# Patient Record
Sex: Female | Born: 1985 | Race: White | Hispanic: No | Marital: Married | Smoking: Current every day smoker
Health system: Southern US, Community
[De-identification: ages and names within clinical notes are randomized; demographics above are authoritative.]

## PROBLEM LIST (undated history)

## (undated) DIAGNOSIS — M1990 Unspecified osteoarthritis, unspecified site: Secondary | ICD-10-CM

## (undated) DIAGNOSIS — F329 Major depressive disorder, single episode, unspecified: Secondary | ICD-10-CM

## (undated) DIAGNOSIS — Z8619 Personal history of other infectious and parasitic diseases: Secondary | ICD-10-CM

## (undated) DIAGNOSIS — Z789 Other specified health status: Secondary | ICD-10-CM

## (undated) DIAGNOSIS — F419 Anxiety disorder, unspecified: Secondary | ICD-10-CM

## (undated) DIAGNOSIS — F32A Depression, unspecified: Secondary | ICD-10-CM

## (undated) HISTORY — PX: ANKLE SURGERY: SHX546

## (undated) HISTORY — PX: ANKLE ARTHROSCOPY: SUR85

## (undated) HISTORY — DX: Personal history of other infectious and parasitic diseases: Z86.19

## (undated) HISTORY — PX: OTHER SURGICAL HISTORY: SHX169

---
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
## 2009-06-20 ENCOUNTER — Emergency Department (HOSPITAL_COMMUNITY): Admission: EM | Admit: 2009-06-20 | Discharge: 2009-06-20 | Payer: Self-pay | Admitting: Emergency Medicine

## 2009-06-25 ENCOUNTER — Ambulatory Visit (HOSPITAL_COMMUNITY)
Admission: RE | Admit: 2009-06-25 | Discharge: 2009-06-25 | Payer: Self-pay | Source: Home / Self Care | Admitting: Unknown Physician Specialty

## 2010-06-24 ENCOUNTER — Ambulatory Visit
Admission: RE | Admit: 2010-06-24 | Discharge: 2010-06-24 | Payer: Self-pay | Source: Home / Self Care | Attending: Unknown Physician Specialty | Admitting: Unknown Physician Specialty

## 2010-06-24 LAB — HCG, SERUM, QUALITATIVE: Preg, Serum: NEGATIVE

## 2010-06-24 LAB — POCT HEMOGLOBIN-HEMACUE: Hemoglobin: 15.7 g/dL — ABNORMAL HIGH (ref 12.0–15.0)

## 2010-09-05 LAB — CBC
HCT: 41.3 % (ref 36.0–46.0)
Hemoglobin: 14.9 g/dL (ref 12.0–15.0)
MCHC: 36 g/dL (ref 30.0–36.0)
MCV: 83.9 fL (ref 78.0–100.0)
Platelets: 248 10*3/uL (ref 150–400)
RBC: 4.91 MIL/uL (ref 3.87–5.11)
RDW: 12.9 % (ref 11.5–15.5)
WBC: 11.1 10*3/uL — ABNORMAL HIGH (ref 4.0–10.5)

## 2011-10-24 LAB — OB RESULTS CONSOLE ANTIBODY SCREEN: Antibody Screen: NEGATIVE

## 2011-10-24 LAB — OB RESULTS CONSOLE GC/CHLAMYDIA
Chlamydia: NEGATIVE
Gonorrhea: NEGATIVE

## 2011-10-24 LAB — OB RESULTS CONSOLE ABO/RH: RH Type: NEGATIVE

## 2011-10-24 LAB — OB RESULTS CONSOLE HEPATITIS B SURFACE ANTIGEN: Hepatitis B Surface Ag: NEGATIVE

## 2011-10-24 LAB — OB RESULTS CONSOLE RPR: RPR: NONREACTIVE

## 2011-10-24 LAB — OB RESULTS CONSOLE RUBELLA ANTIBODY, IGM: Rubella: IMMUNE

## 2011-10-24 LAB — OB RESULTS CONSOLE HIV ANTIBODY (ROUTINE TESTING): HIV: NONREACTIVE

## 2012-03-06 LAB — OB RESULTS CONSOLE GBS: GBS: POSITIVE

## 2012-04-02 ENCOUNTER — Encounter (HOSPITAL_COMMUNITY): Payer: Self-pay | Admitting: *Deleted

## 2012-04-02 ENCOUNTER — Telehealth (HOSPITAL_COMMUNITY): Payer: Self-pay | Admitting: *Deleted

## 2012-04-02 NOTE — Telephone Encounter (Signed)
Preadmission screen  

## 2012-04-09 ENCOUNTER — Inpatient Hospital Stay (HOSPITAL_COMMUNITY)
Admission: RE | Admit: 2012-04-09 | Discharge: 2012-04-12 | DRG: 766 | Disposition: A | Discharge status: Home / Self Care | Payer: Medicaid Other | Source: Ambulatory Visit | Attending: Obstetrics and Gynecology | Admitting: Obstetrics and Gynecology

## 2012-04-09 ENCOUNTER — Encounter (HOSPITAL_COMMUNITY): Payer: Self-pay

## 2012-04-09 DIAGNOSIS — O99892 Other specified diseases and conditions complicating childbirth: Secondary | ICD-10-CM | POA: Diagnosis present

## 2012-04-09 DIAGNOSIS — O330 Maternal care for disproportion due to deformity of maternal pelvic bones: Secondary | ICD-10-CM | POA: Diagnosis present

## 2012-04-09 DIAGNOSIS — O480 Post-term pregnancy: Principal | ICD-10-CM | POA: Diagnosis present

## 2012-04-09 DIAGNOSIS — Z22330 Carrier of Group B streptococcus: Secondary | ICD-10-CM

## 2012-04-09 DIAGNOSIS — O3660X0 Maternal care for excessive fetal growth, unspecified trimester, not applicable or unspecified: Secondary | ICD-10-CM | POA: Diagnosis present

## 2012-04-09 DIAGNOSIS — O339 Maternal care for disproportion, unspecified: Secondary | ICD-10-CM | POA: Diagnosis present

## 2012-04-09 HISTORY — DX: Other specified health status: Z78.9

## 2012-04-09 LAB — CBC
HCT: 39.2 % (ref 36.0–46.0)
RBC: 4.87 MIL/uL (ref 3.87–5.11)
RDW: 13.8 % (ref 11.5–15.5)
WBC: 12.6 10*3/uL — ABNORMAL HIGH (ref 4.0–10.5)

## 2012-04-09 LAB — TYPE AND SCREEN
ABO/RH(D): AB NEG
Antibody Screen: NEGATIVE

## 2012-04-09 MED ORDER — ONDANSETRON HCL 4 MG/2ML IJ SOLN
4.0000 mg | Freq: Four times a day (QID) | INTRAMUSCULAR | Status: DC | PRN
  Administered 2012-04-10: 4 mg via INTRAVENOUS
  Filled 2012-04-09: qty 2

## 2012-04-09 MED ORDER — EPHEDRINE 5 MG/ML INJ: 10.0000 mg | INTRAVENOUS | Status: DC | PRN

## 2012-04-09 MED ORDER — OXYTOCIN BOLUS FROM INFUSION
500.0000 mL | INTRAVENOUS | Status: DC
  Filled 2012-04-09 (×86): qty 500

## 2012-04-09 MED ORDER — FLEET ENEMA 7-19 GM/118ML RE ENEM: 1.0000 | ENEMA | Freq: Once | RECTAL | Status: DC

## 2012-04-09 MED ORDER — PENICILLIN G POTASSIUM 5000000 UNITS IJ SOLR
2.5000 10*6.[IU] | INTRAVENOUS | Status: DC
  Administered 2012-04-10 (×5): 2.5 10*6.[IU] via INTRAVENOUS
  Filled 2012-04-09 (×8): qty 2.5

## 2012-04-09 MED ORDER — IBUPROFEN 600 MG PO TABS: 600.0000 mg | ORAL_TABLET | Freq: Four times a day (QID) | ORAL | Status: DC | PRN

## 2012-04-09 MED ORDER — LIDOCAINE HCL (PF) 1 % IJ SOLN: 30.0000 mL | INTRAMUSCULAR | Status: DC | PRN

## 2012-04-09 MED ORDER — PENICILLIN G POTASSIUM 5000000 UNITS IJ SOLR
5.0000 10*6.[IU] | Freq: Once | INTRAVENOUS | Status: AC
  Administered 2012-04-09: 5 10*6.[IU] via INTRAVENOUS
  Filled 2012-04-09: qty 5

## 2012-04-09 MED ORDER — PHENYLEPHRINE 40 MCG/ML (10ML) SYRINGE FOR IV PUSH (FOR BLOOD PRESSURE SUPPORT)
80.0000 ug | PREFILLED_SYRINGE | INTRAVENOUS | Status: DC | PRN
  Filled 2012-04-09: qty 5

## 2012-04-09 MED ORDER — LACTATED RINGERS IV SOLN
INTRAVENOUS | Status: DC
  Administered 2012-04-09 – 2012-04-10 (×3): via INTRAVENOUS

## 2012-04-09 MED ORDER — DIPHENHYDRAMINE HCL 50 MG/ML IJ SOLN: 12.5000 mg | INTRAMUSCULAR | Status: DC | PRN

## 2012-04-09 MED ORDER — FENTANYL 2.5 MCG/ML BUPIVACAINE 1/10 % EPIDURAL INFUSION (WH - ANES)
14.0000 mL/h | INTRAMUSCULAR | Status: DC
  Administered 2012-04-10 (×2): 14 mL/h via EPIDURAL
  Filled 2012-04-09 (×2): qty 125

## 2012-04-09 MED ORDER — ACETAMINOPHEN 325 MG PO TABS
650.0000 mg | ORAL_TABLET | ORAL | Status: DC | PRN
  Administered 2012-04-10: 650 mg via ORAL
  Filled 2012-04-09: qty 2

## 2012-04-09 MED ORDER — PHENYLEPHRINE 40 MCG/ML (10ML) SYRINGE FOR IV PUSH (FOR BLOOD PRESSURE SUPPORT): 80.0000 ug | PREFILLED_SYRINGE | INTRAVENOUS | Status: DC | PRN

## 2012-04-09 MED ORDER — OXYTOCIN 40 UNITS IN LACTATED RINGERS INFUSION - SIMPLE MED
1.0000 m[IU]/min | INTRAVENOUS | Status: DC
  Administered 2012-04-09: 4 m[IU]/min via INTRAVENOUS
  Administered 2012-04-09: 2 m[IU]/min via INTRAVENOUS

## 2012-04-09 MED ORDER — OXYTOCIN 40 UNITS IN LACTATED RINGERS INFUSION - SIMPLE MED
1.0000 m[IU]/min | INTRAVENOUS | Status: DC
  Filled 2012-04-09: qty 1000

## 2012-04-09 MED ORDER — TERBUTALINE SULFATE 1 MG/ML IJ SOLN: 0.2500 mg | Freq: Once | INTRAMUSCULAR | Status: AC | PRN

## 2012-04-09 MED ORDER — LACTATED RINGERS IV SOLN: 500.0000 mL | INTRAVENOUS | Status: DC | PRN

## 2012-04-09 MED ORDER — CITRIC ACID-SODIUM CITRATE 334-500 MG/5ML PO SOLN
30.0000 mL | ORAL | Status: DC | PRN
  Administered 2012-04-10: 30 mL via ORAL
  Filled 2012-04-09: qty 15

## 2012-04-09 MED ORDER — BUTORPHANOL TARTRATE 1 MG/ML IJ SOLN
1.0000 mg | INTRAMUSCULAR | Status: DC | PRN
  Administered 2012-04-09: 1 mg via INTRAVENOUS
  Filled 2012-04-09: qty 1

## 2012-04-09 MED ORDER — EPHEDRINE 5 MG/ML INJ
10.0000 mg | INTRAVENOUS | Status: DC | PRN
  Filled 2012-04-09: qty 4

## 2012-04-09 MED ORDER — LACTATED RINGERS IV SOLN
500.0000 mL | Freq: Once | INTRAVENOUS | Status: AC
  Administered 2012-04-10: 500 mL via INTRAVENOUS

## 2012-04-09 MED ORDER — OXYCODONE-ACETAMINOPHEN 5-325 MG PO TABS
1.0000 | ORAL_TABLET | ORAL | Status: DC | PRN
Start: 2012-04-09 — End: 2012-04-10

## 2012-04-09 MED ORDER — OXYTOCIN 40 UNITS IN LACTATED RINGERS INFUSION - SIMPLE MED: 62.5000 mL/h | INTRAVENOUS | Status: DC

## 2012-04-10 ENCOUNTER — Encounter (HOSPITAL_COMMUNITY)
Admission: RE | Disposition: A | Discharge status: Home / Self Care | Payer: Self-pay | Source: Ambulatory Visit | Attending: Obstetrics and Gynecology

## 2012-04-10 ENCOUNTER — Encounter (HOSPITAL_COMMUNITY): Payer: Self-pay | Admitting: Anesthesiology

## 2012-04-10 ENCOUNTER — Encounter (HOSPITAL_COMMUNITY): Payer: Self-pay

## 2012-04-10 ENCOUNTER — Inpatient Hospital Stay (HOSPITAL_COMMUNITY): Payer: Medicaid Other | Admitting: Anesthesiology

## 2012-04-10 SURGERY — Surgical Case
Anesthesia: Regional | Site: Abdomen | Wound class: Clean Contaminated

## 2012-04-10 MED ORDER — ONDANSETRON HCL 4 MG/2ML IJ SOLN: 4.0000 mg | Freq: Three times a day (TID) | INTRAMUSCULAR | Status: DC | PRN

## 2012-04-10 MED ORDER — MEPERIDINE HCL 25 MG/ML IJ SOLN
INTRAMUSCULAR | Status: DC | PRN
  Administered 2012-04-10: 25 mg via INTRAVENOUS

## 2012-04-10 MED ORDER — SODIUM CHLORIDE 0.9 % IV SOLN
1.0000 ug/kg/h | INTRAVENOUS | Status: DC | PRN
  Filled 2012-04-10: qty 2.5

## 2012-04-10 MED ORDER — SODIUM BICARBONATE 8.4 % IV SOLN
INTRAVENOUS | Status: DC | PRN
  Administered 2012-04-10: 5 mL via EPIDURAL

## 2012-04-10 MED ORDER — METOCLOPRAMIDE HCL 5 MG/ML IJ SOLN: 10.0000 mg | Freq: Three times a day (TID) | INTRAMUSCULAR | Status: DC | PRN

## 2012-04-10 MED ORDER — OXYTOCIN 40 UNITS IN LACTATED RINGERS INFUSION - SIMPLE MED: 62.5000 mL/h | INTRAVENOUS | Status: AC

## 2012-04-10 MED ORDER — SENNOSIDES-DOCUSATE SODIUM 8.6-50 MG PO TABS
2.0000 | ORAL_TABLET | Freq: Every day | ORAL | Status: DC
  Administered 2012-04-11: 2 via ORAL

## 2012-04-10 MED ORDER — WITCH HAZEL-GLYCERIN EX PADS: 1.0000 "application " | MEDICATED_PAD | CUTANEOUS | Status: DC | PRN

## 2012-04-10 MED ORDER — SODIUM CHLORIDE 0.9 % IJ SOLN: 3.0000 mL | INTRAMUSCULAR | Status: DC | PRN

## 2012-04-10 MED ORDER — ONDANSETRON HCL 4 MG/2ML IJ SOLN: 4.0000 mg | INTRAMUSCULAR | Status: DC | PRN

## 2012-04-10 MED ORDER — LANOLIN HYDROUS EX OINT: 1.0000 "application " | TOPICAL_OINTMENT | CUTANEOUS | Status: DC | PRN

## 2012-04-10 MED ORDER — MENTHOL 3 MG MT LOZG: 1.0000 | LOZENGE | OROMUCOSAL | Status: DC | PRN

## 2012-04-10 MED ORDER — PROMETHAZINE HCL 25 MG/ML IJ SOLN: 6.2500 mg | INTRAMUSCULAR | Status: DC | PRN

## 2012-04-10 MED ORDER — PHENYLEPHRINE HCL 10 MG/ML IJ SOLN
INTRAMUSCULAR | Status: DC | PRN
  Administered 2012-04-10: 80 ug via INTRAVENOUS
  Administered 2012-04-10: 40 ug via INTRAVENOUS
  Administered 2012-04-10 (×3): 80 ug via INTRAVENOUS
  Administered 2012-04-10: 40 ug via INTRAVENOUS

## 2012-04-10 MED ORDER — SCOPOLAMINE 1 MG/3DAYS TD PT72
1.0000 | MEDICATED_PATCH | Freq: Once | TRANSDERMAL | Status: DC
  Administered 2012-04-10: 1.5 mg via TRANSDERMAL

## 2012-04-10 MED ORDER — DIPHENHYDRAMINE HCL 25 MG PO CAPS: 25.0000 mg | ORAL_CAPSULE | ORAL | Status: DC | PRN

## 2012-04-10 MED ORDER — NALBUPHINE HCL 10 MG/ML IJ SOLN
5.0000 mg | INTRAMUSCULAR | Status: DC | PRN
  Filled 2012-04-10: qty 1

## 2012-04-10 MED ORDER — DIPHENHYDRAMINE HCL 25 MG PO CAPS: 25.0000 mg | ORAL_CAPSULE | Freq: Four times a day (QID) | ORAL | Status: DC | PRN

## 2012-04-10 MED ORDER — MORPHINE SULFATE (PF) 0.5 MG/ML IJ SOLN
INTRAMUSCULAR | Status: DC | PRN
  Administered 2012-04-10: 1 mg via INTRAVENOUS
  Administered 2012-04-10: 4 mg via EPIDURAL

## 2012-04-10 MED ORDER — SODIUM BICARBONATE 8.4 % IV SOLN
INTRAVENOUS | Status: AC
  Filled 2012-04-10: qty 50

## 2012-04-10 MED ORDER — LIDOCAINE HCL (PF) 1 % IJ SOLN
INTRAMUSCULAR | Status: DC | PRN
  Administered 2012-04-10 (×2): 5 mL

## 2012-04-10 MED ORDER — NALOXONE HCL 0.4 MG/ML IJ SOLN: 0.4000 mg | INTRAMUSCULAR | Status: DC | PRN

## 2012-04-10 MED ORDER — OXYCODONE-ACETAMINOPHEN 5-325 MG PO TABS
1.0000 | ORAL_TABLET | ORAL | Status: DC | PRN
  Administered 2012-04-11: 1 via ORAL
  Administered 2012-04-11: 2 via ORAL
  Administered 2012-04-11 – 2012-04-12 (×3): 1 via ORAL
  Filled 2012-04-10: qty 2
  Filled 2012-04-10 (×4): qty 1
  Filled 2012-04-10: qty 2

## 2012-04-10 MED ORDER — IBUPROFEN 600 MG PO TABS
600.0000 mg | ORAL_TABLET | Freq: Four times a day (QID) | ORAL | Status: DC
  Administered 2012-04-11 – 2012-04-12 (×6): 600 mg via ORAL
  Filled 2012-04-10 (×6): qty 1

## 2012-04-10 MED ORDER — LACTATED RINGERS IV SOLN
INTRAVENOUS | Status: DC | PRN
  Administered 2012-04-10 (×2): via INTRAVENOUS

## 2012-04-10 MED ORDER — LACTATED RINGERS IV SOLN
INTRAVENOUS | Status: DC
  Administered 2012-04-11: 02:00:00 via INTRAVENOUS

## 2012-04-10 MED ORDER — OXYTOCIN 10 UNIT/ML IJ SOLN
40.0000 [IU] | INTRAVENOUS | Status: DC | PRN
  Administered 2012-04-10: 40 [IU] via INTRAVENOUS

## 2012-04-10 MED ORDER — KETOROLAC TROMETHAMINE 30 MG/ML IJ SOLN
INTRAMUSCULAR | Status: AC
  Filled 2012-04-10: qty 1

## 2012-04-10 MED ORDER — ONDANSETRON HCL 4 MG/2ML IJ SOLN
INTRAMUSCULAR | Status: AC
  Filled 2012-04-10: qty 2

## 2012-04-10 MED ORDER — LIDOCAINE-EPINEPHRINE (PF) 2 %-1:200000 IJ SOLN
INTRAMUSCULAR | Status: AC
  Filled 2012-04-10: qty 20

## 2012-04-10 MED ORDER — OXYTOCIN 10 UNIT/ML IJ SOLN
INTRAMUSCULAR | Status: AC
  Filled 2012-04-10: qty 4

## 2012-04-10 MED ORDER — SIMETHICONE 80 MG PO CHEW: 80.0000 mg | CHEWABLE_TABLET | ORAL | Status: DC | PRN

## 2012-04-10 MED ORDER — MORPHINE SULFATE 0.5 MG/ML IJ SOLN
INTRAMUSCULAR | Status: AC
  Filled 2012-04-10: qty 10

## 2012-04-10 MED ORDER — MEPERIDINE HCL 25 MG/ML IJ SOLN
6.2500 mg | INTRAMUSCULAR | Status: DC | PRN
  Administered 2012-04-10: 6.25 mg via INTRAVENOUS

## 2012-04-10 MED ORDER — MIDAZOLAM HCL 2 MG/2ML IJ SOLN: 0.5000 mg | Freq: Once | INTRAMUSCULAR | Status: DC | PRN

## 2012-04-10 MED ORDER — DIBUCAINE 1 % RE OINT: 1.0000 "application " | TOPICAL_OINTMENT | RECTAL | Status: DC | PRN

## 2012-04-10 MED ORDER — MEPERIDINE HCL 25 MG/ML IJ SOLN: 6.2500 mg | INTRAMUSCULAR | Status: DC | PRN

## 2012-04-10 MED ORDER — KETOROLAC TROMETHAMINE 30 MG/ML IJ SOLN
30.0000 mg | Freq: Four times a day (QID) | INTRAMUSCULAR | Status: DC | PRN
  Administered 2012-04-10: 30 mg via INTRAMUSCULAR

## 2012-04-10 MED ORDER — ONDANSETRON HCL 4 MG PO TABS: 4.0000 mg | ORAL_TABLET | ORAL | Status: DC | PRN

## 2012-04-10 MED ORDER — TETANUS-DIPHTH-ACELL PERTUSSIS 5-2.5-18.5 LF-MCG/0.5 IM SUSP
0.5000 mL | Freq: Once | INTRAMUSCULAR | Status: AC
  Administered 2012-04-11: 0.5 mL via INTRAMUSCULAR

## 2012-04-10 MED ORDER — MEPERIDINE HCL 25 MG/ML IJ SOLN
INTRAMUSCULAR | Status: AC
  Filled 2012-04-10: qty 1

## 2012-04-10 MED ORDER — ACETAMINOPHEN 10 MG/ML IV SOLN
1000.0000 mg | Freq: Four times a day (QID) | INTRAVENOUS | Status: AC | PRN
  Administered 2012-04-11: 1000 mg via INTRAVENOUS
  Filled 2012-04-10 (×2): qty 100

## 2012-04-10 MED ORDER — SIMETHICONE 80 MG PO CHEW
80.0000 mg | CHEWABLE_TABLET | Freq: Three times a day (TID) | ORAL | Status: DC
  Administered 2012-04-11 – 2012-04-12 (×5): 80 mg via ORAL

## 2012-04-10 MED ORDER — SCOPOLAMINE 1 MG/3DAYS TD PT72
MEDICATED_PATCH | TRANSDERMAL | Status: AC
  Administered 2012-04-10: 1.5 mg via TRANSDERMAL
  Filled 2012-04-10: qty 1

## 2012-04-10 MED ORDER — PRENATAL MULTIVITAMIN CH
1.0000 | ORAL_TABLET | Freq: Every day | ORAL | Status: DC
  Administered 2012-04-11 – 2012-04-12 (×2): 1 via ORAL
  Filled 2012-04-10: qty 1

## 2012-04-10 MED ORDER — DIPHENHYDRAMINE HCL 50 MG/ML IJ SOLN: 25.0000 mg | INTRAMUSCULAR | Status: DC | PRN

## 2012-04-10 MED ORDER — NALBUPHINE HCL 10 MG/ML IJ SOLN
5.0000 mg | INTRAMUSCULAR | Status: DC | PRN
  Administered 2012-04-10: 5 mg via INTRAVENOUS
  Filled 2012-04-10 (×2): qty 1

## 2012-04-10 MED ORDER — ZOLPIDEM TARTRATE 5 MG PO TABS: 5.0000 mg | ORAL_TABLET | Freq: Every evening | ORAL | Status: DC | PRN

## 2012-04-10 MED ORDER — FENTANYL CITRATE 0.05 MG/ML IJ SOLN
INTRAMUSCULAR | Status: AC
  Administered 2012-04-10: 50 ug via INTRAVENOUS
  Filled 2012-04-10: qty 2

## 2012-04-10 MED ORDER — KETOROLAC TROMETHAMINE 30 MG/ML IJ SOLN: 30.0000 mg | Freq: Four times a day (QID) | INTRAMUSCULAR | Status: DC | PRN

## 2012-04-10 MED ORDER — FENTANYL CITRATE 0.05 MG/ML IJ SOLN
25.0000 ug | INTRAMUSCULAR | Status: DC | PRN
  Administered 2012-04-10 (×2): 50 ug via INTRAVENOUS

## 2012-04-10 MED ORDER — FENTANYL CITRATE 0.05 MG/ML IJ SOLN
INTRAMUSCULAR | Status: AC
  Filled 2012-04-10: qty 2

## 2012-04-10 MED ORDER — DIPHENHYDRAMINE HCL 50 MG/ML IJ SOLN: 12.5000 mg | INTRAMUSCULAR | Status: DC | PRN

## 2012-04-10 MED ORDER — ONDANSETRON HCL 4 MG/2ML IJ SOLN
INTRAMUSCULAR | Status: DC | PRN
  Administered 2012-04-10: 4 mg via INTRAVENOUS

## 2012-04-10 MED ORDER — FENTANYL CITRATE 0.05 MG/ML IJ SOLN
INTRAMUSCULAR | Status: DC | PRN
  Administered 2012-04-10: 100 ug via INTRAVENOUS

## 2012-04-10 SURGICAL SUPPLY — 36 items
ADH SKN CLS APL DERMABOND .7 (GAUZE/BANDAGES/DRESSINGS) ×1
CLOTH BEACON ORANGE TIMEOUT ST (SAFETY) ×2 IMPLANT
DERMABOND ADVANCED (GAUZE/BANDAGES/DRESSINGS) ×1
DERMABOND ADVANCED .7 DNX12 (GAUZE/BANDAGES/DRESSINGS) IMPLANT
DRAPE SURG 17X23 STRL (DRAPES) ×2 IMPLANT
DRESSING TELFA 8X3 (GAUZE/BANDAGES/DRESSINGS) ×2 IMPLANT
DRSG COVADERM 4X10 (GAUZE/BANDAGES/DRESSINGS) IMPLANT
DURAPREP 26ML APPLICATOR (WOUND CARE) ×2 IMPLANT
ELECT REM PT RETURN 9FT ADLT (ELECTROSURGICAL) ×2
ELECTRODE REM PT RTRN 9FT ADLT (ELECTROSURGICAL) ×1 IMPLANT
EXTRACTOR VACUUM M CUP 4 TUBE (SUCTIONS) IMPLANT
GAUZE SPONGE 4X4 12PLY STRL LF (GAUZE/BANDAGES/DRESSINGS) ×4 IMPLANT
GLOVE BIO SURGEON STRL SZ7 (GLOVE) ×4 IMPLANT
GOWN PREVENTION PLUS LG XLONG (DISPOSABLE) ×4 IMPLANT
KIT ABG SYR 3ML LUER SLIP (SYRINGE) IMPLANT
NEEDLE HYPO 25X5/8 SAFETYGLIDE (NEEDLE) IMPLANT
NS IRRIG 1000ML POUR BTL (IV SOLUTION) ×2 IMPLANT
PACK C SECTION WH (CUSTOM PROCEDURE TRAY) ×2 IMPLANT
PAD ABD 7.5X8 STRL (GAUZE/BANDAGES/DRESSINGS) ×2 IMPLANT
PAD OB MATERNITY 4.3X12.25 (PERSONAL CARE ITEMS) IMPLANT
RTRCTR C-SECT PINK 25CM LRG (MISCELLANEOUS) ×1 IMPLANT
RTRCTR C-SECT PINK 34CM XLRG (MISCELLANEOUS) IMPLANT
SLEEVE SCD COMPRESS KNEE MED (MISCELLANEOUS) IMPLANT
SPONGE GAUZE 4X4 12PLY (GAUZE/BANDAGES/DRESSINGS) ×1 IMPLANT
STAPLER VISISTAT 35W (STAPLE) IMPLANT
SUT CHROMIC 1 CTX 36 (SUTURE) ×5 IMPLANT
SUT CHROMIC 2 0 CT 1 (SUTURE) ×2 IMPLANT
SUT PDS AB 0 CTX 60 (SUTURE) ×2 IMPLANT
SUT PLAIN 2 0 XLH (SUTURE) ×2 IMPLANT
SUT VIC AB 2-0 CT1 27 (SUTURE) ×2
SUT VIC AB 2-0 CT1 TAPERPNT 27 (SUTURE) ×1 IMPLANT
SUT VIC AB 4-0 KS 27 (SUTURE) ×1 IMPLANT
TAPE CLOTH SURG 4X10 WHT LF (GAUZE/BANDAGES/DRESSINGS) ×1 IMPLANT
TOWEL OR 17X24 6PK STRL BLUE (TOWEL DISPOSABLE) ×4 IMPLANT
TRAY FOLEY CATH 14FR (SET/KITS/TRAYS/PACK) ×3 IMPLANT
WATER STERILE IRR 1000ML POUR (IV SOLUTION) ×2 IMPLANT

## 2012-04-10 NOTE — Anesthesia Procedure Notes (Signed)
Epidural Patient location during procedure: OB Start time: 04/10/2012 3:30 AM  Staffing Anesthesiologist: Brayton Caves R Performed by: anesthesiologist   Preanesthetic Checklist Completed: patient identified, site marked, surgical consent, pre-op evaluation, timeout performed, IV checked, risks and benefits discussed and monitors and equipment checked  Epidural Patient position: sitting Prep: site prepped and draped and DuraPrep Patient monitoring: continuous pulse ox and blood pressure Approach: midline Injection technique: LOR air and LOR saline  Needle:  Needle type: Tuohy  Needle gauge: 17 G Needle length: 9 cm and 9 Needle insertion depth: 8 cm Catheter type: closed end flexible Catheter size: 19 Gauge Catheter at skin depth: 14 cm Test dose: negative  Assessment Events: blood not aspirated, injection not painful, no injection resistance, negative IV test and no paresthesia  Additional Notes Patient identified.  Risk benefits discussed including failed block, incomplete pain control, headache, nerve damage, paralysis, blood pressure changes, nausea, vomiting, reactions to medication both toxic or allergic, and postpartum back pain.  Patient expressed understanding and wished to proceed.  All questions were answered.  Sterile technique used throughout procedure and epidural site dressed with sterile barrier dressing. No paresthesia or other complications noted.The patient did not experience any signs of intravascular injection such as tinnitus or metallic taste in mouth nor signs of intrathecal spread such as rapid motor block. Please see nursing notes for vital signs.

## 2012-04-10 NOTE — Progress Notes (Signed)
Press photographer, house coverage, anesthesia, and nursery notified of c/s.  Chg bath done.  Betadine nasal swab done. Abdominal shave done.

## 2012-04-10 NOTE — H&P (Signed)
Pt is a 26 year old white female G1P0 at [redacted] weeks EGA who is admitted for induction secondary to postterm pregnancy. PNC was uncomplicated. Pt had a normal Quad screen, an abnormal 1 hour OGTT but a normal 3 hour OGTT.  Pt had a +GBS. On admission pt was contracting every 3-5 minutes.  Her cervix was 3cm.  PMHx: See [place].  PE: VSSAF        HEENT-wnl        ABD- gravid, non tender        FHTs- reactive IMP/ IUP at 41 weeks,         Postterm Plan/ Admit for induction+

## 2012-04-10 NOTE — Progress Notes (Signed)
Pt now 5cm. Pt had SROM with clear fluid. FHTs reactive.

## 2012-04-10 NOTE — Progress Notes (Signed)
Patient ID: Veronica Richardson, female   DOB: September 30, 1985, 26 y.o.   MRN: [id number]  S: Comfortable O: AVFSS cvx 5/80/-2 toco Q2-4, MVUs 240 FHT 140 reactive no decels  A/P 1) No significant change in cervix since IUPC placement despite adequate MVUs 2) FWB reassuring

## 2012-04-10 NOTE — Anesthesia Preprocedure Evaluation (Signed)

## 2012-04-10 NOTE — OR Nursing (Addendum)
FETAL HEART RATE 135 in OR  2001 FEMALE  Placenta to OR utility  Surgeon wanted dermabond and pressure dressing explained that we were instructed not to place dressing over dermabond surgeon insisted

## 2012-04-10 NOTE — Transfer of Care (Signed)
Immediate Anesthesia Transfer of Care Note  Patient: Veronica Richardson  Procedure(s) Performed: Procedure(s) (LRB) with comments: CESAREAN SECTION (N/A)  Patient Location: PACU  Anesthesia Type: Epidural  Level of Consciousness: awake, alert  and oriented  Airway & Oxygen Therapy: Patient Spontanous Breathing  Post-op Assessment: Report given to PACU RN and Post -op Vital signs reviewed and stable  Post vital signs: Reviewed and stable  Complications: No apparent anesthesia complications

## 2012-04-10 NOTE — Op Note (Signed)
Pre-Operative Diagnosis: 1) 41 week intrauterine pregnancy 2) Failure to progress 3) Suspected macrosomia Postoperative Diagnosis: Same Procedure: Primary low transverse cesarean section Surgeon: Dr. Waynard Reeds Assistant: none Operative Findings: Vigorous female infant in vertex occiput posterior presentation with apgars of 9 & 9. Normal ovaries, tubes and uterus.  Weight pending Specimen: Placenta for disposal EBL: Total I/O In: 1500 [I.V.:1500] Out: 800 [Urine:100; Blood:700]   Procedure:Veronica Richardson is an 26 year old gravida 1 para 0 at 41 weeks and 0 days estimated gestational age who presents for cesarean section. The patient was admitted for a postdates induction of labor on 10/21. At that time she was 3cm dilated and the patient was contracting. Pitocin was initiated and the patient progressed to 5 cm at 630 am on 10/22. Amniotomy and an IUPC were placed at 9:30 am.  Labor was adequate by MVUs after IUPC placement.  Labor continued to be adequate an by 5:30 pm the patient had only dilated to 5-6 cm.  Given adequate labor for 9 hours and no significant change the decision was made to proceed with cesarean section.  R/B/A reviewed with the patient. Following the appropriate informed consent the patient was brought to the operating room where epidural anesthesia was administered and found to be adequate. She was placed in the dorsal supine position with a leftward tilt. She was prepped and draped in the normal sterile fashion. Scalpel was then used to make a Pfannenstiel skin incision which was carried down to the underlying layers of soft tissue to the fascia. The fascia was incised in the midline and the fascial incision was extended laterally with Mayo scissors. The superior aspect of the fascial incision was grasped with Coker clamps x2, tented up and the rectus muscles dissected off sharply with the electrocautery unit area and the same procedure was repeated on the inferior aspect of the  fascial incision. The rectus muscles were separated in the midline. The abdominal peritoneum was identified, tented up, entered sharply, and the incision was extended superiorly and inferiorly with good visualization of the bladder. The Alexis retractor was then deployed. The vesicouterine peritoneum was identified, tented up, entered sharply, and the bladder flap was created digitally. Scalpel was then used to make a low transverse incision on the uterus which was extended laterally with both blunt dissection and the bandage scissors. The fetal vertex was identified, delivered easily through the uterine incision followed by the body. The infant was bulb suctioned on the operative field cried vigorously, cord was clamped and cut and the infant was passed to the waiting neonatologist. Placenta was then delivered spontaneously, the uterus was cleared of all clot and debris. The uterine incision was repaired with #1 chromic in running locked fashion followed by a second imbricating layer. Ovaries and tubes were inspected and normal. The Alexis retractor was removed. The uterus was returned to the abdominal cavity the abdominal cavity was cleared of all clot and debris. The abdominal peritoneum was reapproximated with 2-0 Vicryl in a running fashion, the rectus muscles was reapproximated with #1 chromic in a running fashion. The fascia was closed with a looped PDS in a running fashion. The skin was closed with 4-0 vicryl in a subcuticular fashion and Dermabond. All sponge lap and needle counts were correct x2. Patient tolerated the procedure well and recovered in stable condition following the procedure.

## 2012-04-10 NOTE — Progress Notes (Signed)
  Patient ID: Rolanda Jay, female   DOB: 06-Aug-1985, 26 y.o.   MRN: [id number]  S: Comfortable with epidural O:  Filed Vitals:   04/10/12 1101 04/10/12 1106 04/10/12 1132 04/10/12 1201  BP: 128/67  97/53 103/49  Pulse: 100  98 97  Temp:  98.8 F (37.1 C)    TempSrc:  Oral    Resp:  18    Height:      Weight:      SpO2:       AOX3, NAD Obese, gravid, soft FHT 140 reactive with accels, no decels CVX 5/80/-2, pubic arch palpably narrow toco irregularly tracing, Q1-4, MVUs after IUPC placement 240  A/P IUPC placed without difficulty Cervix unchanged since 630 this morning. Pt has been followed for S>D during this pregnancy. Multiple growth scans have shown EFW > 90%.  Last ultrasound 2 weeks ago showed EFW 8#7.  Given adequate MVUs this is concerning for CPD

## 2012-04-10 NOTE — Consult Note (Signed)
Neonatology Note:   Attendance at C-section:    I was asked to attend this primary C/S at term due to [facility]. The mother is a G1P0 AB neg, GBS pos with abnormal 1 hour GTT but normal 3-hour test. She smoked prior to pregnancy, but stopped when she became pregnant. ROM 18 hours prior to delivery, fluid meconium-stained. Mother received Pen G for almost 24 hours prior to delivery and has been afebrile during labor. Infant vigorous at birth with good spontaneous cry and tone. Needed only minimal bulb suctioning. Ap 9/9. Baby is LGA with molded head and deep sacral dimple. Lungs clear to ausc in DR. To CN to care of Pediatrician.   Deatra James, MD

## 2012-04-10 NOTE — Progress Notes (Signed)
Patient ID: Veronica Richardson, female   DOB: 1985-11-30, 26 y.o.   MRN: [id number]  S: Still comfortable O: AFVSS cvx 5+/90/-1 FHT 140 reactive with accels toco q2-4  A/P 1) No significant cervical change in 12 hours.  Adequate labor since IUPC placed. Will proceed with cesarean section for failure to progress, suspected CPD. R/B/A reviewed with patient.

## 2012-04-10 NOTE — Anesthesia Postprocedure Evaluation (Signed)
Anesthesia Post Note  Patient: Veronica Richardson  Procedure(s) Performed: Procedure(s) (LRB): CESAREAN SECTION (N/A)  Anesthesia type: Epidural  Patient location: PACU  Post pain: Pain level controlled  Post assessment: Post-op Vital signs reviewed  Last Vitals:  Filed Vitals:   04/10/12 2053  BP:   Pulse:   Temp: 36.8 C  Resp: 16    Post vital signs: Reviewed  Level of consciousness: awake  Complications: No apparent anesthesia complications

## 2012-04-11 ENCOUNTER — Encounter (HOSPITAL_COMMUNITY): Payer: Self-pay | Admitting: Obstetrics and Gynecology

## 2012-04-11 LAB — CBC
HCT: 32.2 % — ABNORMAL LOW (ref 36.0–46.0)
MCV: 81.1 fL (ref 78.0–100.0)
RDW: 13.8 % (ref 11.5–15.5)
WBC: 14.5 10*3/uL — ABNORMAL HIGH (ref 4.0–10.5)

## 2012-04-11 MED ORDER — RHO D IMMUNE GLOBULIN 1500 UNIT/2ML IJ SOLN
300.0000 ug | Freq: Once | INTRAMUSCULAR | Status: AC
  Administered 2012-04-11: 300 ug via INTRAMUSCULAR
  Filled 2012-04-11: qty 2

## 2012-04-11 NOTE — Anesthesia Postprocedure Evaluation (Signed)
  Anesthesia Post-op Note  Patient: Veronica Richardson  Procedure(s) Performed: Procedure(s) (LRB) with comments: CESAREAN SECTION (N/A)  Patient Location: Mother/Baby  Anesthesia Type: Epidural  Level of Consciousness: awake, alert  and oriented  Airway and Oxygen Therapy: Patient Spontanous Breathing  Post-op Pain: mild  Post-op Assessment: Post-op Vital signs reviewed, Patient's Cardiovascular Status Stable, No headache, No backache, No residual numbness and No residual motor weakness  Post-op Vital Signs: Reviewed and stable  Complications: No apparent anesthesia complications

## 2012-04-11 NOTE — Progress Notes (Signed)
Post Op Day 1 Subjective: no complaints  Objective: Blood pressure 153/93, pulse 93, temperature 98.7 F (37.1 C), temperature source Oral, resp. rate 20, height 5\' 6"  (1.676 m), weight 226 lb (102.513 kg), breastfeeding.  Physical Exam:  General: alert Lochia: appropriate Uterine Fundus: firm Incision: no significant drainage   Basename 04/11/12 0500 04/09/12 1925  HGB 10.9* 13.4  HCT 32.2* 39.2    Assessment/Plan: Stable.  Will continue post op observation.   LOS: 2 days   Veronica Richardson D 04/11/2012, 9:08 AM

## 2012-04-11 NOTE — Addendum Note (Signed)
Addendum  created 04/11/12 0804 by Graciela Husbands, CRNA   Modules edited:Notes Section

## 2012-04-11 NOTE — Progress Notes (Signed)
Ur chart review completed.  
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
## 2012-04-12 LAB — RH IG WORKUP (INCLUDES ABO/RH)
Fetal Screen: NEGATIVE
Unit division: 0

## 2012-04-12 MED ORDER — OXYCODONE-ACETAMINOPHEN 5-325 MG PO TABS
1.0000 | ORAL_TABLET | ORAL | Status: DC | PRN
Start: 2012-04-12 — End: 2013-01-12

## 2012-04-12 MED ORDER — OXYCODONE-ACETAMINOPHEN 5-325 MG PO TABS: 1.0000 | ORAL_TABLET | ORAL | Status: DC | PRN

## 2012-04-12 NOTE — Progress Notes (Signed)
  Patient is eating, ambulating, voiding.  Pain control is good.  Filed Vitals:   04/11/12 1425 04/11/12 1915 04/11/12 2205 04/12/12 0505  BP: 109/66 120/79 125/83 124/78  Pulse: 103 105 98 93  Temp: 98.1 F (36.7 C) 98.2 F (36.8 C) 98.2 F (36.8 C) 98.3 F (36.8 C)  TempSrc:  Oral Oral Oral  Resp: 20 18 20 20   Height:      Weight:      SpO2:  98%  98%    lungs:   clear to auscultation cor:    RRR Abdomen:  soft, appropriate tenderness, incisions intact and without erythema or exudate ex:    no cords   Lab Results  Component Value Date   WBC 14.5* 04/11/2012   HGB 10.9* 04/11/2012   HCT 32.2* 04/11/2012   MCV 81.1 04/11/2012   PLT 125* 04/11/2012    --/--/AB NEG (10/23 0500)/RI  A/P    Post operative day 2.  Routine post op and postpartum care.  Expect d/c today.  Percocet for pain control.  Baby RH +- Rhogam given.

## 2012-04-20 NOTE — Discharge Summary (Signed)
Obstetric Discharge Summary Reason for Admission: induction of labor Prenatal Procedures: NST Intrapartum Procedures: cesarean: low cervical, transverse Postpartum Procedures: none Complications-Operative and Postpartum: none Hemoglobin  Date Value Range Status  04/11/2012 10.9* 12.0 - 15.0 g/dL Final     DELTA CHECK NOTED     REPEATED TO VERIFY     HCT  Date Value Range Status  04/11/2012 32.2* 36.0 - 46.0 % Final   Hospital Course: Pt admitted for induction for postdates, dilated to 5 cm and then did not progress.  Pt went for uncomplicated c/s and then was d/ced without comp on POD 2.  Discharge Diagnoses: Term Pregnancy-delivered  Discharge Information: Date: 04/20/2012 Activity: pelvic rest Diet: routine Medications: Percocet Condition: stable Instructions: refer to practice specific booklet Discharge to: home   Newborn Data: Live born female  Birth Weight: 9 lb 3.6 oz (4184 g) APGAR: 9, 9  Home with mother.  Veronica Richardson A 04/20/2012, 1:23 PM

## 2013-01-12 ENCOUNTER — Encounter (HOSPITAL_COMMUNITY): Payer: Self-pay | Admitting: Emergency Medicine

## 2013-01-12 ENCOUNTER — Emergency Department (INDEPENDENT_AMBULATORY_CARE_PROVIDER_SITE_OTHER)
Admission: EM | Admit: 2013-01-12 | Discharge: 2013-01-12 | Disposition: A | Discharge status: Home / Self Care | Payer: No Typology Code available for payment source | Source: Home / Self Care

## 2013-01-12 DIAGNOSIS — K0889 Other specified disorders of teeth and supporting structures: Secondary | ICD-10-CM

## 2013-01-12 DIAGNOSIS — K089 Disorder of teeth and supporting structures, unspecified: Secondary | ICD-10-CM

## 2013-01-12 MED ORDER — HYDROCODONE-ACETAMINOPHEN 5-325 MG PO TABS: 1.0000 | ORAL_TABLET | Freq: Four times a day (QID) | ORAL | Status: DC | PRN

## 2013-01-12 MED ORDER — AMOXICILLIN-POT CLAVULANATE 500-125 MG PO TABS: 1.0000 | ORAL_TABLET | Freq: Three times a day (TID) | ORAL | Status: DC

## 2013-01-12 NOTE — ED Notes (Signed)
Pt c/o dental pain onset 3 weeks... sxs include: pain, headache... Denies: fevers, swelling... Taking ibup/tyle w/no relief... Alert w/no signs of acute distress.

## 2013-01-12 NOTE — ED Provider Notes (Signed)
Veronica Richardson is a 27 y.o. female who presents to Urgent Care today for tooth pain. This is been present for several weeks. She has pain in her upper right tooth. She has  Tried over-the-counter pain medications which have not worked. She denies any fevers or chills. She has the pain radiates to her right face. She does not yet have a dental appointment.    PMH reviewed. Otherwise healthy History  Substance Use Topics  . Smoking status: Former Smoker    Quit date: 04/02/2012  . Smokeless tobacco: Never Used  . Alcohol Use: No   ROS as above Medications reviewed. No current facility-administered medications for this encounter.   Current Outpatient Prescriptions  Medication Sig Dispense Refill  . amoxicillin-clavulanate (AUGMENTIN) 500-125 MG per tablet Take 1 tablet (500 mg total) by mouth 3 (three) times daily.  30 tablet  0  . HYDROcodone-acetaminophen (NORCO) 5-325 MG per tablet Take 1 tablet by mouth every 6 (six) hours as needed for pain.  30 tablet  0  . omeprazole (PRILOSEC) 20 MG capsule Take 20 mg by mouth daily.        Exam:  BP 120/80  Pulse 84  Temp(Src) 98.5 F (36.9 C) (Oral)  Resp 18  SpO2 99%  Breastfeeding? No Gen: Well NAD HEENT: EOMI,  MMM, gumline retraction surrounding the right upper premolar tender to touch with surrounding erythema.  Lungs: CTABL Nl WOB Heart: RRR no MRG Abd: NABS, NT, ND Exts: Non edematous BL  LE, warm and well perfused.   No results found for this or any previous visit (from the past 24 hour(s)). No results found.  Assessment and Plan: 27 y.o. female with dental pain.  Likely infected tooth.  Plan to treat with Augmentin and Norco.  Refer to dentistry.  Discussed warning signs or symptoms. Please see discharge instructions. Patient expresses understanding.      Rodolph Bong, MD 01/12/13 Zollie Pee

## 2013-01-17 ENCOUNTER — Emergency Department (HOSPITAL_COMMUNITY)
Admission: EM | Admit: 2013-01-17 | Discharge: 2013-01-17 | Disposition: A | Discharge status: Home / Self Care | Payer: No Typology Code available for payment source | Attending: Emergency Medicine | Admitting: Emergency Medicine

## 2013-01-17 ENCOUNTER — Emergency Department (HOSPITAL_COMMUNITY): Payer: No Typology Code available for payment source

## 2013-01-17 ENCOUNTER — Ambulatory Visit: Payer: No Typology Code available for payment source | Attending: Family Medicine | Admitting: Family Medicine

## 2013-01-17 ENCOUNTER — Encounter (HOSPITAL_COMMUNITY): Payer: Self-pay | Admitting: *Deleted

## 2013-01-17 ENCOUNTER — Encounter: Payer: Self-pay | Admitting: Family Medicine

## 2013-01-17 VITALS — BP 114/77 | HR 79 | Temp 98.7°F | Ht 66.0 in | Wt 198.0 lb

## 2013-01-17 DIAGNOSIS — Y9389 Activity, other specified: Secondary | ICD-10-CM | POA: Insufficient documentation

## 2013-01-17 DIAGNOSIS — S161XXA Strain of muscle, fascia and tendon at neck level, initial encounter: Secondary | ICD-10-CM

## 2013-01-17 DIAGNOSIS — R1031 Right lower quadrant pain: Secondary | ICD-10-CM | POA: Insufficient documentation

## 2013-01-17 DIAGNOSIS — Z3202 Encounter for pregnancy test, result negative: Secondary | ICD-10-CM | POA: Insufficient documentation

## 2013-01-17 DIAGNOSIS — Z8619 Personal history of other infectious and parasitic diseases: Secondary | ICD-10-CM | POA: Insufficient documentation

## 2013-01-17 DIAGNOSIS — S0990XA Unspecified injury of head, initial encounter: Secondary | ICD-10-CM | POA: Insufficient documentation

## 2013-01-17 DIAGNOSIS — K029 Dental caries, unspecified: Secondary | ICD-10-CM | POA: Insufficient documentation

## 2013-01-17 DIAGNOSIS — Z87891 Personal history of nicotine dependence: Secondary | ICD-10-CM | POA: Insufficient documentation

## 2013-01-17 DIAGNOSIS — Y92410 Unspecified street and highway as the place of occurrence of the external cause: Secondary | ICD-10-CM | POA: Insufficient documentation

## 2013-01-17 DIAGNOSIS — K0510 Chronic gingivitis, plaque induced: Secondary | ICD-10-CM

## 2013-01-17 DIAGNOSIS — K089 Disorder of teeth and supporting structures, unspecified: Secondary | ICD-10-CM | POA: Insufficient documentation

## 2013-01-17 DIAGNOSIS — Z792 Long term (current) use of antibiotics: Secondary | ICD-10-CM | POA: Insufficient documentation

## 2013-01-17 DIAGNOSIS — S139XXA Sprain of joints and ligaments of unspecified parts of neck, initial encounter: Secondary | ICD-10-CM | POA: Insufficient documentation

## 2013-01-17 LAB — POCT I-STAT, CHEM 8
BUN: 12 mg/dL (ref 6–23)
Creatinine, Ser: 0.8 mg/dL (ref 0.50–1.10)
Glucose, Bld: 90 mg/dL (ref 70–99)
Hemoglobin: 16.7 g/dL — ABNORMAL HIGH (ref 12.0–15.0)
Potassium: 4 mEq/L (ref 3.5–5.1)
Sodium: 140 mEq/L (ref 135–145)

## 2013-01-17 LAB — URINALYSIS, ROUTINE W REFLEX MICROSCOPIC
Bilirubin Urine: NEGATIVE
Glucose, UA: NEGATIVE mg/dL
Ketones, ur: NEGATIVE mg/dL
Leukocytes, UA: NEGATIVE
Nitrite: NEGATIVE
Protein, ur: 100 mg/dL — AB
Specific Gravity, Urine: 1.024 (ref 1.005–1.030)
Urobilinogen, UA: 1 mg/dL (ref 0.0–1.0)
pH: 6 (ref 5.0–8.0)

## 2013-01-17 LAB — URINE MICROSCOPIC-ADD ON

## 2013-01-17 MED ORDER — TETANUS-DIPHTH-ACELL PERTUSSIS 5-2.5-18.5 LF-MCG/0.5 IM SUSP
0.5000 mL | Freq: Once | INTRAMUSCULAR | Status: AC
  Administered 2013-01-17: 0.5 mL via INTRAMUSCULAR
  Filled 2013-01-17: qty 0.5

## 2013-01-17 MED ORDER — IBUPROFEN 800 MG PO TABS: 800.0000 mg | ORAL_TABLET | Freq: Three times a day (TID) | ORAL | Status: DC

## 2013-01-17 MED ORDER — HYDROCODONE-ACETAMINOPHEN 5-325 MG PO TABS: 2.0000 | ORAL_TABLET | ORAL | Status: DC | PRN

## 2013-01-17 MED ORDER — IBUPROFEN 800 MG PO TABS: 800.0000 mg | ORAL_TABLET | Freq: Three times a day (TID) | ORAL | Status: DC | PRN

## 2013-01-17 MED ORDER — IOHEXOL 300 MG/ML  SOLN
80.0000 mL | Freq: Once | INTRAMUSCULAR | Status: AC | PRN
  Administered 2013-01-17: 90 mL via INTRAVENOUS

## 2013-01-17 MED ORDER — HYDROCODONE-ACETAMINOPHEN 5-325 MG PO TABS: 1.0000 | ORAL_TABLET | Freq: Three times a day (TID) | ORAL | Status: DC | PRN

## 2013-01-17 NOTE — Discharge Instructions (Signed)
Cervical Sprain There is no evidence of broken bone or serious injury. Return to the ED if you develop new or worsening symptoms. A cervical sprain is an injury in the neck in which the ligaments are stretched or torn. The ligaments are the tissues that hold the bones of the neck (vertebrae) in place.Cervical sprains can range from very mild to very severe. Most cervical sprains get better in 1 to 3 weeks, but it depends on the cause and extent of the injury. Severe cervical sprains can cause the neck vertebrae to be unstable. This can lead to damage of the spinal cord and can result in serious nervous system problems. Your caregiver will determine whether your cervical sprain is mild or severe. CAUSES  Severe cervical sprains may be caused by:  Contact sport injuries (football, rugby, wrestling, hockey, auto racing, gymnastics, diving, martial arts, boxing).  Motor vehicle collisions.  Whiplash injuries. This means the neck is forcefully whipped backward and forward.  Falls. Mild cervical sprains may be caused by:   Awkward positions, such as cradling a telephone between your ear and shoulder.  Sitting in a chair that does not offer proper support.  Working at a poorly Marketing executive station.  Activities that require looking up or down for long periods of time. SYMPTOMS   Pain, soreness, stiffness, or a burning sensation in the front, back, or sides of the neck. This discomfort may develop immediately after injury or it may develop slowly and not begin for 24 hours or more after an injury.  Pain or tenderness directly in the middle of the back of the neck.  Shoulder or upper back pain.  Limited ability to move the neck.  Headache.  Dizziness.  Weakness, numbness, or tingling in the hands or arms.  Muscle spasms.  Difficulty swallowing or chewing.  Tenderness and swelling of the neck. DIAGNOSIS  Most of the time, your caregiver can diagnose this problem by taking your  history and doing a physical exam. Your caregiver will ask about any known problems, such as arthritis in the neck or a previous neck injury. X-rays may be taken to find out if there are any other problems, such as problems with the bones of the neck. However, an X-ray often does not reveal the full extent of a cervical sprain. Other tests such as a computed tomography (CT) scan or magnetic resonance imaging (MRI) may be needed. TREATMENT  Treatment depends on the severity of the cervical sprain. Mild sprains can be treated with rest, keeping the neck in place (immobilization), and pain medicines. Severe cervical sprains need immediate immobilization and an appointment with an orthopedist or neurosurgeon. Several treatment options are available to help with pain, muscle spasms, and other symptoms. Your caregiver may prescribe:  Medicines, such as pain relievers, numbing medicines, or muscle relaxants.  Physical therapy. This can include stretching exercises, strengthening exercises, and posture training. Exercises and improved posture can help stabilize the neck, strengthen muscles, and help stop symptoms from returning.  A neck collar to be worn for short periods of time. Often, these collars are worn for comfort. However, certain collars may be worn to protect the neck and prevent further worsening of a serious cervical sprain. HOME CARE INSTRUCTIONS   Put ice on the injured area.  Put ice in a plastic bag.  Place a towel between your skin and the bag.  Leave the ice on for 15-20 minutes, 3-4 times a day.  Only take over-the-counter or prescription medicines for pain,  discomfort, or fever as directed by your caregiver.  Keep all follow-up appointments as directed by your caregiver.  Keep all physical therapy appointments as directed by your caregiver.  If a neck collar is prescribed, wear it as directed by your caregiver.  Do not drive while wearing a neck collar.  Make any needed  adjustments to your work station to promote good posture.  Avoid positions and activities that make your symptoms worse.  Warm up and stretch before being active to help prevent problems. SEEK MEDICAL CARE IF:   Your pain is not controlled with medicine.  You are unable to decrease your pain medicine over time as planned.  Your activity level is not improving as expected. SEEK IMMEDIATE MEDICAL CARE IF:   You develop any bleeding, stomach upset, or signs of an allergic reaction to your medicine.  Your symptoms get worse.  You develop new, unexplained symptoms.  You have numbness, tingling, weakness, or paralysis in any part of your body. MAKE SURE YOU:   Understand these instructions.  Will watch your condition.  Will get help right away if you are not doing well or get worse. Document Released: 04/03/2007 Document Revised: 08/29/2011 Document Reviewed: 03/09/2011 [facility] Patient Information 2014 [place], [place].
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
## 2013-01-17 NOTE — Progress Notes (Signed)
Patient ID: Veronica Richardson, female   DOB: 13-Sep-1985, 27 y.o.   MRN: [id number]  CC:  Tooth Pain  HPI:  27 year old white female is here for right upper tooth pain for the past month that is getting worse.  She was seen at the Urgent Care Saturday and was given Augmentin and Norco.  She is till taking the antibiotics, and claims the Norco has not helped with her pain.  Patient is not sure if dental referral was made or not.  Claims she is in severe pain 10/10. The pain causes her to have headache.  She denies any fever.  No Known Allergies Past Medical History  Diagnosis Date  . H/O varicella   . No pertinent past medical history    Current Outpatient Prescriptions on File Prior to Visit  Medication Sig Dispense Refill  . amoxicillin-clavulanate (AUGMENTIN) 500-125 MG per tablet Take 1 tablet (500 mg total) by mouth 3 (three) times daily.  30 tablet  0  . HYDROcodone-acetaminophen (NORCO) 5-325 MG per tablet Take 1 tablet by mouth every 6 (six) hours as needed for pain.  30 tablet  0  . omeprazole (PRILOSEC) 20 MG capsule Take 20 mg by mouth daily.       No current facility-administered medications on file prior to visit.   Family History  Problem Relation Age of Onset  . Hypertension Father   . Hypertension Maternal Aunt   . COPD Maternal Aunt   . Diabetes Maternal Aunt   . Bipolar disorder Maternal Aunt   . Hypertension Maternal Uncle   . Hypertension Paternal Uncle   . Diabetes Paternal Uncle   . Heart disease Maternal Grandmother   . Heart disease Maternal Grandfather    History   Social History  . Marital Status: Single    Spouse Name: N/A    Number of Children: N/A  . Years of Education: N/A   Occupational History  . Not on file.   Social History Main Topics  . Smoking status: Former Smoker    Quit date: 04/02/2012  . Smokeless tobacco: Never Used  . Alcohol Use: No  . Drug Use: No  . Sexually Active: Yes   Other Topics Concern  . Not on file   Social  History Narrative  . No narrative on file    Review of Systems ______ Constitutional: Negative for fever, chills, diaphoresis, activity change, appetite change and fatigue. ____ HENT: Negative for ear pain, nosebleeds, congestion, facial swelling, rhinorrhea, neck pain, neck stiffness and ear discharge.  ____ Eyes: Negative for pain, discharge, redness, itching and visual disturbance. ____ Respiratory: Negative for cough, choking, chest tightness, shortness of breath, wheezing and stridor.  ____ Cardiovascular: Negative for chest pain, palpitations and leg swelling. ____ Gastrointestinal: Negative for abdominal distention. ____ Genitourinary: Negative for dysuria, urgency, frequency, hematuria, flank pain, decreased urine volume, difficulty urinating and dyspareunia. ____ Musculoskeletal: Negative for back pain, joint swelling, arthralgias and gait problem. ________ Neurological: Negative for dizziness, tremors, seizures, syncope, facial asymmetry, speech difficulty, weakness, light-headedness, numbness and headaches. ____ Hematological: Negative for adenopathy. Does not bruise/bleed easily. ____ Psychiatric/Behavioral: Negative for hallucinations, behavioral problems, confusion, dysphoric mood, decreased concentration and agitation. ______   Objective:   Filed Vitals:   01/17/13 1157  BP: 114/77  Pulse: 79  Temp: 98.7 F (37.1 C)    Physical Exam ______ Constitutional: Appears well-developed and well-nourished. No distress. ____ HENT: Normocephalic. External right and left ear normal. Oropharynx is clear and moist. ____ Eyes:  Conjunctivae and EOM are normal. PERRLA, no scleral icterus. ____ Neck: Normal ROM. Neck supple. No JVD. No tracheal deviation. No thyromegaly. ____ CVS: RRR, S1/S2 +, no murmurs, no gallops, no carotid bruit.  Pulmonary: Effort and breath sounds normal, no stridor, rhonchi, wheezes, rales.  Abdominal: Soft. BS +,  no distension, tenderness, rebound or  guarding. ________ Musculoskeletal: Normal range of motion. No edema and no tenderness. ____ Lymphadenopathy: No lymphadenopathy noted, cervical, inguinal. Neuro: Alert. Normal reflexes, muscle tone coordination. No cranial nerve deficit. Skin: Skin is warm and dry. No rash noted. Not diaphoretic. No erythema. No pallor. ____ Psychiatric: Normal mood and affect. Behavior, judgment, thought content normal. __  Lab Results  Component Value Date   WBC 14.5* 04/11/2012   HGB 10.9* 04/11/2012   HCT 32.2* 04/11/2012   MCV 81.1 04/11/2012   PLT 125* 04/11/2012   No results found for this basename: CREATININE, BUN, NA, K, CL, CO2    No results found for this basename: HGBA1C   Lipid Panel  No results found for this basename: chol, trig, hdl, cholhdl, vldl, ldlcalc       Assessment and plan:   There are no active problems to display for this patient.

## 2013-01-17 NOTE — Progress Notes (Signed)
Patient ID: Veronica Richardson, female   DOB: 1985/10/11, 26 y.o.   MRN: [id number]  CC:  New Patient   HPI: Pt complaining of dental pain for 1 month. She was seen at urgent care about 4 days ago and given augmentin and vicodin.  Still having pain and pt is asking for dental referral.  Pt has a [facility] card.  Pt says she is doubling up on vicodin dose because of pain.  No nausea or vomiting.  No fever or chills.  No facial pain.   No Known Allergies Past Medical History  Diagnosis Date  . H/O varicella   . No pertinent past medical history    Current Outpatient Prescriptions on File Prior to Visit  Medication Sig Dispense Refill  . amoxicillin-clavulanate (AUGMENTIN) 500-125 MG per tablet Take 1 tablet (500 mg total) by mouth 3 (three) times daily.  30 tablet  0  . omeprazole (PRILOSEC) 20 MG capsule Take 20 mg by mouth daily.       No current facility-administered medications on file prior to visit.   Family History  Problem Relation Age of Onset  . Hypertension Father   . Hypertension Maternal Aunt   . COPD Maternal Aunt   . Diabetes Maternal Aunt   . Bipolar disorder Maternal Aunt   . Hypertension Maternal Uncle   . Hypertension Paternal Uncle   . Diabetes Paternal Uncle   . Heart disease Maternal Grandmother   . Heart disease Maternal Grandfather    History   Social History  . Marital Status: Single    Spouse Name: N/A    Number of Children: N/A  . Years of Education: N/A   Occupational History  . Not on file.   Social History Main Topics  . Smoking status: Former Smoker    Quit date: 04/02/2012  . Smokeless tobacco: Never Used  . Alcohol Use: No  . Drug Use: No  . Sexually Active: Yes   Other Topics Concern  . Not on file   Social History Narrative  . No narrative on file    Review of Systems  Constitutional: Negative for fever, chills, diaphoresis, activity change, appetite change and fatigue.  HENT: Negative for ear pain, nosebleeds, congestion, facial  swelling, rhinorrhea, neck pain, neck stiffness and ear discharge.  Dental pain as above.  Eyes: Negative for pain, discharge, redness, itching and visual disturbance.  Respiratory: Negative for cough, choking, chest tightness, shortness of breath, wheezing and stridor.   Cardiovascular: Negative for chest pain, palpitations and leg swelling.  Gastrointestinal: Negative for abdominal distention.  Genitourinary: Negative for dysuria, urgency, frequency, hematuria, flank pain, decreased urine volume, difficulty urinating and dyspareunia.  Musculoskeletal: Negative for back pain, joint swelling, arthralgias and gait problem.  Neurological: Negative for dizziness, tremors, seizures, syncope, facial asymmetry, speech difficulty, weakness, light-headedness, numbness and headaches.  Hematological: Negative for adenopathy. Does not bruise/bleed easily.  Psychiatric/Behavioral: Negative for hallucinations, behavioral problems, confusion, dysphoric mood, decreased concentration and agitation.    Objective:   Filed Vitals:   01/17/13 1157  BP: 114/77  Pulse: 79  Temp: 98.7 F (37.1 C)    Physical Exam  Constitutional: Appears well-developed and well-nourished. No distress.  HENT: Normocephalic. External right and left ear normal. Oropharynx is clear and moist. severe gingivitis seen.  Eyes: Conjunctivae and EOM are normal. PERRLA, no scleral icterus.  Neck: Normal ROM. Neck supple. No JVD. No tracheal deviation. No thyromegaly.  CVS: RRR, S1/S2 +, no murmurs, no gallops, no carotid bruit.  Pulmonary: Effort and breath sounds normal, no stridor, rhonchi, wheezes, rales.  Abdominal: Soft. BS +,  no distension, tenderness, rebound or guarding.  Musculoskeletal: Normal range of motion. No edema and no tenderness.  Lymphadenopathy: No lymphadenopathy noted, cervical, inguinal. Neuro: Alert. Normal reflexes, muscle tone coordination. No cranial nerve deficit. Skin: Skin is warm and dry. No rash  noted. Not diaphoretic. No erythema. No pallor.  Psychiatric: Normal mood and affect. Behavior, judgment, thought content normal.   Lab Results  Component Value Date   WBC 14.5* 04/11/2012   HGB 10.9* 04/11/2012   HCT 32.2* 04/11/2012   MCV 81.1 04/11/2012   PLT 125* 04/11/2012   No results found for this basename: CREATININE, BUN, NA, K, CL, CO2    No results found for this basename: HGBA1C   Lipid Panel  No results found for this basename: chol, trig, hdl, cholhdl, vldl, ldlcalc       Assessment and plan:   Patient Active Problem List   Diagnosis Date Noted  . Pain due to dental caries 01/17/2013  . Gingivitis 01/17/2013   Ibuprofen 800 mg po every 8 hours prn pain   Vicodin prn severe pain  Dental referral made for urgent care  The patient was given clear instructions to go to ER or return to medical center if symptoms don't improve, worsen or new problems develop.  The patient verbalized understanding.  The patient was told to call to get any lab results if not heard anything in the next week.    RTC in 3 months  Rodney Langton, MD, CDE, FAAFP [facility] [place], [place]
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
## 2013-01-17 NOTE — Patient Instructions (Addendum)
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
## 2013-01-17 NOTE — ED Notes (Addendum)
Pt placed on bedpan. No signs of distress. PT informed her husband will be back. Pt reports she is anxious.

## 2013-01-18 LAB — URINE CULTURE: Culture: NO GROWTH

## 2014-04-21 ENCOUNTER — Encounter (HOSPITAL_COMMUNITY): Payer: Self-pay | Admitting: *Deleted

## 2016-07-12 ENCOUNTER — Emergency Department (HOSPITAL_COMMUNITY)
Admission: EM | Admit: 2016-07-12 | Discharge: 2016-07-13 | Disposition: A | Discharge status: Home / Self Care | Payer: Self-pay | Attending: Emergency Medicine | Admitting: Emergency Medicine

## 2016-07-12 ENCOUNTER — Encounter (HOSPITAL_COMMUNITY): Payer: Self-pay | Admitting: Emergency Medicine

## 2016-07-12 ENCOUNTER — Emergency Department (HOSPITAL_COMMUNITY): Payer: Self-pay

## 2016-07-12 DIAGNOSIS — G8929 Other chronic pain: Secondary | ICD-10-CM | POA: Insufficient documentation

## 2016-07-12 DIAGNOSIS — M25571 Pain in right ankle and joints of right foot: Secondary | ICD-10-CM | POA: Insufficient documentation

## 2016-07-12 DIAGNOSIS — Z87891 Personal history of nicotine dependence: Secondary | ICD-10-CM | POA: Insufficient documentation

## 2016-07-12 DIAGNOSIS — Z79899 Other long term (current) drug therapy: Secondary | ICD-10-CM | POA: Insufficient documentation

## 2016-07-12 MED ORDER — OXYCODONE-ACETAMINOPHEN 5-325 MG PO TABS
1.0000 | ORAL_TABLET | Freq: Once | ORAL | Status: AC
  Administered 2016-07-13: 1 via ORAL
  Filled 2016-07-12: qty 1

## 2016-07-12 NOTE — ED Triage Notes (Signed)
Pt. reports worsening chronic right ankle pain for several months unrelieved by OTC pain medications , denies injury/ambulatory .

## 2016-07-12 NOTE — ED Provider Notes (Signed)
[facility]-EMERGENCY DEPT Provider Note   CSN: [id number] Arrival date & time: 07/12/16  2207  By signing my name below, I, Javier Docker, attest that this documentation has been prepared under the direction and in the presence of Felicie Morn, NP. Electronically Signed: Javier Docker, ER Scribe. 01/30/2016. 11:57 PM.  History   Chief Complaint Chief Complaint  Patient presents with  . Ankle Pain    Right   The history is provided by the patient. No language interpreter was used.   HPI Comments: Veronica Richardson is a 31 y.o. female who presents to the Emergency Department complaining of an acute exacerbation over the last several days of chronic right ankle pain. She has a past hx of ankle break followed by surgical repair. Her surgery was done at [facility]. She has NKDA. She denies weakness, numbness or tingling. No recent injury to ankle. 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   Past Medical History:  Diagnosis Date  . H/O varicella   . No pertinent past medical history     Patient Active Problem List   Diagnosis Date Noted  . Pain due to dental caries 01/17/2013  . Gingivitis 01/17/2013    Past Surgical History:  Procedure Laterality Date  . ANKLE SURGERY Right   . CESAREAN SECTION  04/10/2012   Procedure: CESAREAN SECTION;  Surgeon: Freddrick March. Tenny Craw, MD;  Location: [facility];  Service: Obstetrics;  Laterality: N/A;  . right ankle surgery      OB History    Gravida Para Term Preterm AB Living   1 1 1  0 0 1   SAB TAB Ectopic Multiple Live Births   0 0 0 0 1       Home Medications    Prior to Admission medications   Medication Sig Start Date End Date Taking? Authorizing Provider  amoxicillin-clavulanate (AUGMENTIN) 500-125 MG per tablet Take 1 tablet (500 mg total) by mouth 3 (three) times daily. 01/12/13   Rodolph Bong, MD  HYDROcodone-acetaminophen (NORCO/VICODIN) 5-325 MG per tablet Take 1-2 tablets by mouth every 6 (six) hours as needed for  pain.    Historical Provider, MD  HYDROcodone-acetaminophen (NORCO/VICODIN) 5-325 MG per tablet Take 2 tablets by mouth every 4 (four) hours as needed for pain. 01/17/13   Glynn Octave, MD  ibuprofen (ADVIL,MOTRIN) 800 MG tablet Take 1 tablet (800 mg total) by mouth 3 (three) times daily. 01/17/13   Glynn Octave, MD    Family History Family History  Problem Relation Age of Onset  . Hypertension Father   . Hypertension Maternal Aunt   . COPD Maternal Aunt   . Diabetes Maternal Aunt   . Bipolar disorder Maternal Aunt   . Hypertension Maternal Uncle   . Hypertension Paternal Uncle   . Diabetes Paternal Uncle   . Heart disease Maternal Grandmother   . Heart disease Maternal Grandfather     Social History Social History  Substance Use Topics  . Smoking status: Former Smoker    Quit date: 04/02/2012  . Smokeless tobacco: Never Used  . Alcohol use No     Allergies   Patient has no known allergies.   Review of Systems Review of Systems  Constitutional: Negative for chills and fever.  Musculoskeletal: Positive for gait problem and joint swelling.  All other systems reviewed and are negative.    Physical Exam Updated Vital Signs BP 151/84 (BP Location: Right Arm)   Pulse 87   Temp 98.3 F (36.8  C) (Oral)   Resp 18   LMP 07/09/2016   SpO2 100%   Physical Exam  Constitutional: She is oriented to person, place, and time. She appears well-developed and well-nourished. No distress.  HENT:  Head: Normocephalic and atraumatic.  Eyes: Pupils are equal, round, and reactive to light.  Neck: Neck supple.  Cardiovascular: Normal rate.   Pulmonary/Chest: Effort normal. No respiratory distress.  Musculoskeletal: Normal range of motion.  On left ankle: lateral malleolar TTP without redness or swelling. No indication of infection. Full ROM but limited inversion due to pain.   Neurological: She is alert and oriented to person, place, and time. Coordination normal.  Skin: Skin  is warm and dry. She is not diaphoretic.  Psychiatric: She has a normal mood and affect. Her behavior is normal.  Nursing note and vitals reviewed.    ED Treatments / Results  DIAGNOSTIC STUDIES: Oxygen Saturation is 100% on RA, normal by my interpretation.    COORDINATION OF CARE: 11:55 PM Discussed treatment plan with pt at bedside and pt agreed to plan.  Labs (all labs ordered are listed, but only abnormal results are displayed) Labs Reviewed - No data to display  EKG  EKG Interpretation None       Radiology Dg Ankle Complete Right  Result Date: 07/12/2016 CLINICAL DATA:  Right ankle pain.  History of remote fracture. EXAM: RIGHT ANKLE - COMPLETE 3+ VIEW COMPARISON:  06/20/2009 radiographs of the right ankle FINDINGS: Posttraumatic osteoarthritis of left ankle joint is noted slightly anteriorly off the tibial plafond and dorsum of the talus. Old ossific posttraumatic densities are seen off the medial malleolus. No acute fractures noted. Calcaneal enthesophytes are present along the plantar dorsal aspect. Subtalar joint is maintained without significant joint space narrowing. Slight joint space narrowing across the dorsum of the talonavicular and midfoot articulations with slight dorsal spurring. IMPRESSION: Interval development of a moderate degree of osteoarthritic joint space narrowing and spurring involving the ankle joint likely representing posttraumatic osteoarthritis. Talonavicular and midfoot osteoarthritis with dorsal spurring is also seen. No acute osseous abnormality. Calcaneal enthesophytes are present. Electronically Signed   By: Tollie Ethavid  Kwon M.D.   On: 07/12/2016 23:01    Procedures Procedures (including critical care time)  Medications Ordered in ED Medications - No data to display   Initial Impression / Assessment and Plan / ED Course  I have reviewed the triage vital signs and the nursing notes.  Pertinent labs & imaging results that were available during my  care of the patient were reviewed by me and considered in my medical decision making (see chart for details).    Patient X-Ray negative for obvious fracture or dislocation.  Pt advised to follow up with orthopedics. Patient given ace wrap while in ED, conservative therapy recommended and discussed. Patient will be discharged home & is agreeable with above plan. Returns precautions discussed. Pt appears safe for discharge.   Final Clinical Impressions(s) / ED Diagnoses   Final diagnoses:  Chronic pain of right ankle    New Prescriptions Discharge Medication List as of 07/13/2016 12:11 AM    START taking these medications   Details  naproxen (NAPROSYN) 500 MG tablet Take 1 tablet (500 mg total) by mouth 2 (two) times daily., Starting Wed 07/13/2016, Print        I personally performed the services described in this documentation, which was scribed in my presence. The recorded information has been reviewed and is accurate.    Felicie Mornavid Manju Kulkarni, NP 07/13/16 [phone]  Dione Booze, MD 07/13/16 [phone]

## 2016-07-13 MED ORDER — NAPROXEN 500 MG PO TABS: 500.0000 mg | ORAL_TABLET | Freq: Two times a day (BID) | ORAL | 0 refills | Status: DC

## 2017-01-31 ENCOUNTER — Emergency Department (HOSPITAL_COMMUNITY)
Admission: EM | Admit: 2017-01-31 | Discharge: 2017-01-31 | Disposition: A | Discharge status: Home / Self Care | Payer: Self-pay | Attending: Physician Assistant | Admitting: Physician Assistant

## 2017-01-31 ENCOUNTER — Encounter (HOSPITAL_COMMUNITY): Payer: Self-pay

## 2017-01-31 ENCOUNTER — Emergency Department (HOSPITAL_COMMUNITY): Payer: Self-pay

## 2017-01-31 DIAGNOSIS — M25571 Pain in right ankle and joints of right foot: Secondary | ICD-10-CM | POA: Insufficient documentation

## 2017-01-31 DIAGNOSIS — G8929 Other chronic pain: Secondary | ICD-10-CM

## 2017-01-31 DIAGNOSIS — Z87891 Personal history of nicotine dependence: Secondary | ICD-10-CM | POA: Insufficient documentation

## 2017-01-31 MED ORDER — MELOXICAM 7.5 MG PO TABS: 7.5000 mg | ORAL_TABLET | Freq: Every day | ORAL | 0 refills | Status: DC

## 2017-01-31 NOTE — ED Triage Notes (Signed)
Per Pt, Pt has hx of breaking right leg in three places. Pt was diagnosed with arthritis. 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 Pt complains the pain is increasing and moving to her shin.

## 2017-01-31 NOTE — ED Notes (Signed)
Patient transported to X-ray 

## 2017-01-31 NOTE — ED Provider Notes (Signed)
[facility] Provider Note   CSN: [id number] Arrival date & time: 01/31/17  1419  By signing my name below, I, Rosario Adie, attest that this documentation has been prepared under the direction and in the presence of Graciella Freer, PA-C.  Electronically Signed: Rosario Adie, ED Scribe. 01/31/17. 4:04 PM.  History   Chief Complaint Chief Complaint  Patient presents with  . Arthritis   The history is provided by the patient. No language interpreter was used.   HPI Comments: Veronica Richardson is a 31 y.o. female who presents to the Emergency Department complaining of acute on chronic, persistent, aching right ankle pain beginning several days ago. She notes radiation of her pain into her shin. Pt reports that in 2011 she fractured her ankle in three places; she has had issues with arthritis since then and she states that her pain today is consistent with a flare-up of this. 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 She also notes some associated catching and grinding sensations to the area. Her pain is worse with ambulation and weight bearing. Pt has been taking Ibuprofen at home w/o significant relief of her pain. She has previously been followed by orthopedics from her prior ankle fracture, but has not followed up with them in several years. Pt denies fever, weakness, numbness, redness, swelling, or any other associated symptoms.   Past Medical History:  Diagnosis Date  . H/O varicella   . No pertinent past medical history    Patient Active Problem List   Diagnosis Date Noted  . Pain due to dental caries 01/17/2013  . Gingivitis 01/17/2013   Past Surgical History:  Procedure Laterality Date  . ANKLE SURGERY Right   . CESAREAN SECTION  04/10/2012   Procedure: CESAREAN SECTION;  Surgeon: Freddrick March. Tenny Craw, MD;  Location: [facility];  Service: Obstetrics;  Laterality: N/A;  . right  ankle surgery     OB History    Gravida Para Term Preterm AB Living   1 1 1  0 0 1   SAB TAB Ectopic Multiple Live Births   0 0 0 0 1     Home Medications    Prior to Admission medications   Medication Sig Start Date End Date Taking? Authorizing Provider  amoxicillin-clavulanate (AUGMENTIN) 500-125 MG per tablet Take 1 tablet (500 mg total) by mouth 3 (three) times daily. 01/12/13   Rodolph Bong, MD  HYDROcodone-acetaminophen (NORCO/VICODIN) 5-325 MG per tablet Take 1-2 tablets by mouth every 6 (six) hours as needed for pain.    [provider]  HYDROcodone-acetaminophen (NORCO/VICODIN) 5-325 MG per tablet Take 2 tablets by mouth every 4 (four) hours as needed for pain. 01/17/13   Rancour, Jeannett Senior, MD  ibuprofen (ADVIL,MOTRIN) 800 MG tablet Take 1 tablet (800 mg total) by mouth 3 (three) times daily. 01/17/13   Rancour, Jeannett Senior, MD  meloxicam (MOBIC) 7.5 MG tablet Take 1 tablet (7.5 mg total) by mouth daily. 01/31/17   Maxwell Caul, PA-C  naproxen (NAPROSYN) 500 MG tablet Take 1 tablet (500 mg total) by mouth 2 (two) times daily. 07/13/16   Felicie Morn, NP   Family History Family History  Problem Relation Age of Onset  . Hypertension Father   . Hypertension Maternal Aunt   . COPD Maternal Aunt   . Diabetes Maternal Aunt   . Bipolar disorder Maternal Aunt   .  Hypertension Maternal Uncle   . Hypertension Paternal Uncle   . Diabetes Paternal Uncle   . Heart disease Maternal Grandmother   . Heart disease Maternal Grandfather    Social History Social History  Substance Use Topics  . Smoking status: Former Smoker    Packs/day: 0.50    Types: Cigarettes    Quit date: 04/02/2012  . Smokeless tobacco: Never Used  . Alcohol use No   Allergies   Patient has no known allergies.  Review of Systems Review of Systems  Constitutional: Negative for fever.  Musculoskeletal: Positive for arthralgias, arthritis and myalgias. Negative for joint swelling.   Physical  Exam Updated Vital Signs BP 140/87 (BP Location: Left Arm)   Pulse 70   Temp 98 F (36.7 C) (Oral)   Resp 14   Ht 5\' 6"  (1.676 m)   Wt 61 kg (134 lb 6.4 oz)   LMP 01/23/2017   SpO2 99%   BMI 21.69 kg/m   Physical Exam  Constitutional: She appears well-developed and well-nourished.  Sitting comfortably on examination table  HENT:  Head: Normocephalic and atraumatic.  Eyes: Conjunctivae and EOM are normal. Right eye exhibits no discharge. Left eye exhibits no discharge. No scleral icterus.  Cardiovascular:  Pulses:      Dorsalis pedis pulses are 2+ on the right side, and 2+ on the left side.  Pulmonary/Chest: Effort normal.  Musculoskeletal:  Tenderness palpation to the medial malleolus that extends into the distal tib-fib. No overlying warmth, erythema, ecchymosis. No deformity or crepitus noted. Plantarflexion and dorsiflexion of right ankle intact without any difficulty. Mild tenderness palpation to the distal tib-fib, particular to the lateral aspect. No deformity or crepitus noted. Full flexion/extension of bilateral knees intact without difficulty. No tenderness palpation of bilateral knees. No bilateral lower extremity edema.  Neurological: She is alert.  Follows commands, Moves all extremities  5/5 strength to BUE and BLE  Dorsiflexion and plantarflexion intact bilaterally. Sensation intact throughout all major nerve distributions of the bilateral lower extremities.  Skin: Skin is warm and dry. Capillary refill takes less than 2 seconds.  No warmth, erythema noted to the bilateral lower extremities.  Psychiatric: She has a normal mood and affect. Her speech is normal and behavior is normal.  Nursing note and vitals reviewed.  ED Treatments / Results  DIAGNOSTIC STUDIES: Oxygen Saturation is 100% on RA, normal by my interpretation.   COORDINATION OF CARE: 4:04 PM-Discussed next steps with pt. Pt verbalized understanding and is agreeable with the plan.   Labs (all  labs ordered are listed, but only abnormal results are displayed) Labs Reviewed - No data to display  EKG  EKG Interpretation None      Radiology Dg Tibia/fibula Right  Result Date: 01/31/2017 CLINICAL DATA:  Right lower leg pain without known injury. EXAM: RIGHT TIBIA AND FIBULA - 2 VIEW COMPARISON:  None. FINDINGS: There is no evidence of fracture or other focal bone lesions. Soft tissues are unremarkable. IMPRESSION: Normal right tibia and fibula. Electronically Signed   By: Lupita Raider, M.D.   On: 01/31/2017 15:33   Dg Ankle Complete Right  Result Date: 01/31/2017 CLINICAL DATA:  Chronic right ankle pain without recent injury. EXAM: RIGHT ANKLE - COMPLETE 3+ VIEW COMPARISON:  Radiographs of July 12, 2016. FINDINGS: There is no evidence of fracture, dislocation, or joint effusion. Mild narrowing and osteophyte formation of the talotibial joint is noted. Soft tissues are unremarkable. IMPRESSION: Mild degenerative changes seen involving the talotibial joint. No acute  abnormality seen in the right ankle. Electronically Signed   By: Lupita RaiderJames  Green Jr, M.D.   On: 01/31/2017 15:31   Procedures Procedures   Medications Ordered in ED Medications - No data to display  Initial Impression / Assessment and Plan / ED Course  I have reviewed the triage vital signs and the nursing notes.  Pertinent labs & imaging results that were available during my care of the patient were reviewed by me and considered in my medical decision making (see chart for details).     31 y.o. F with past medical history of ankle fracture 2011 and chronic ankle pain who presents with right ankle pain No recent trauma, fall, injury. No numbness/weakness. Patient is afebrile, non-toxic appearing, sitting comfortably on examination table. Vital signs reviewed and stable. Patient is neurovascularly intact. History/physical exam are not concerning for septic arthritis, maisonneuve fracture, DVT. Consider chronic pain of  right ankle first arthritis. Also consider sprain. Low suspicion for fracture versus dislocation. X-rays ordered at triage.  X-rays reviewed. Tib-fib x-rays negative for any acute fracture dislocation. Ankle x-ray shows mild degenerative changes seen in the talotibial joint. There is no joint widening. Discussed results with patient. Will provide splint and postop shoe for support and stabilization. Conservative therapies discussed. We'll plan to treat symptomatically. Will plan to provide outpatient orthopedic referral for further evaluation and follow-up. Strict return precautions discussed. Patient expresses understanding and agreement to plan.    Final Clinical Impressions(s) / ED Diagnoses   Final diagnoses:  Chronic pain of right ankle   New Prescriptions Discharge Medication List as of 01/31/2017  4:30 PM    START taking these medications   Details  meloxicam (MOBIC) 7.5 MG tablet Take 1 tablet (7.5 mg total) by mouth daily., Starting Tue 01/31/2017, Print       I personally performed the services described in this documentation, which was scribed in my presence. The recorded information has been reviewed and is accurate.     Maxwell CaulLayden, Joanna Hall A, PA-C 01/31/17 1718    Abelino DerrickMackuen, Courteney Lyn, MD 02/01/17 [phone]

## 2017-01-31 NOTE — Discharge Instructions (Signed)
Takes Mobic as instructed.  Continue to ice, elevate, rest the ankle.  Wear the splint to help with support and stabilization.  Follow-up with referred orthopedic doctor for further evaluation and potential further imaging.  Return to the emergency department for any worsening pain, redness/swelling of the ankle, fever, numbness/weakness of the foot, difficulty walking or any other worsening or concerning symptoms.

## 2017-01-31 NOTE — ED Notes (Addendum)
Ace wrap and post op shoe applied per MintoLindsey, GeorgiaPA.

## 2017-02-15 ENCOUNTER — Ambulatory Visit (INDEPENDENT_AMBULATORY_CARE_PROVIDER_SITE_OTHER): Payer: Self-pay | Admitting: Physician Assistant

## 2017-02-27 ENCOUNTER — Ambulatory Visit (INDEPENDENT_AMBULATORY_CARE_PROVIDER_SITE_OTHER): Payer: Self-pay | Admitting: Physician Assistant

## 2017-02-27 ENCOUNTER — Encounter (INDEPENDENT_AMBULATORY_CARE_PROVIDER_SITE_OTHER): Payer: Self-pay | Admitting: Physician Assistant

## 2017-02-27 VITALS — Ht 66.0 in | Wt 135.0 lb

## 2017-02-27 DIAGNOSIS — M19171 Post-traumatic osteoarthritis, right ankle and foot: Secondary | ICD-10-CM

## 2017-02-27 NOTE — Progress Notes (Signed)
Office Visit Note   Patient: Veronica Richardson           Date of Birth: [birth year]           MRN: [id number] Visit Date: 02/27/2017              Requested by: No referring provider defined for this encounter. PCP: Patient, No Pcp Per   Assessment & Plan: Visit Diagnoses:  1. Post-traumatic osteoarthritis of right ankle     Plan:Spoke with her about her posttraumatic arthritis in  the ankle and particularly anterior impingement that she's having. Recommended either intra-articular injection in the ankle or ankle arthroscopy with extensive debridement to remove the anterior impingement. She would like to proceed with the ankle arthroscopy with extensive derivative debridement. Did find an extra boot here at the office then turned back in a cylinder not filling needed it and this was given to her. Questions encouraged and answered by Dr. Magnus Ivan itself about ankle arthroscopy. We'll see her back 1 week after the ankle arthroscopy check her range of motion.  Follow-Up Instructions: Return in about 1 week (around 03/06/2017) for post op.   Orders:  No orders of the defined types were placed in this encounter.  No orders of the defined types were placed in this encounter.     Procedures: No procedures performed   Clinical Data: No additional findings.   Subjective: Chief Complaint  Patient presents with  . Right Ankle - Pain    HPI Mrs. follow-up is a 31 year old female comes in today with right ankle pain. She states she broke her ankle in 2011 underwent surgery for the ankle by another physician here in town she's had pain in the ankle since 2012. 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 She notes swelling of the ankle. Pain starting to radiate up the right leg into her shin area. She's tried wrap up ASO brace which has not helped. She is seen in the ER on 01/31/2017 due to her ankle pain and radiographs were obtained no acute fracture  was seen. She had anterior impingement with osteophyte formation the talotibial joint. Review of Systems   Objective: Vital Signs: Ht  (1.676 m)   Wt 135 lb (61.2 kg)   BMI 21.79 kg/m   Physical Exam  Constitutional: She is oriented to person, place, and time. She appears well-developed and well-nourished. No distress.  Cardiovascular: Intact distal pulses.   Neurological: She is alert and oriented to person, place, and time.  Skin: Skin is warm and dry. She is not diaphoretic.  Psychiatric: She has a normal mood and affect.    Ortho Exam Right ankle compared to the left she has lacks approximately 10 of dorsiflexion. She's able to invert and evert foot. No tenderness over the posterior tibial tendon and peroneal tendons. She has tenderness and edema over the anterior aspect of the ankle joint. No erythema or lower.  Specialty Comments:  No specialty comments available.  Imaging: No results found.   PMFS History: Patient Active Problem List   Diagnosis Date Noted  . Pain due to dental caries 01/17/2013  . Gingivitis 01/17/2013   Past Medical History:  Diagnosis Date  . H/O varicella   . No pertinent past medical history     Family History  Problem Relation Age of Onset  . Hypertension Father   . Hypertension Maternal Aunt   . COPD Maternal Aunt   .  Diabetes Maternal Aunt   . Bipolar disorder Maternal Aunt   . Hypertension Maternal Uncle   . Hypertension Paternal Uncle   . Diabetes Paternal Uncle   . Heart disease Maternal Grandmother   . Heart disease Maternal Grandfather     Past Surgical History:  Procedure Laterality Date  . ANKLE SURGERY Right   . CESAREAN SECTION  04/10/2012   Procedure: CESAREAN SECTION;  Surgeon: Freddrick MarchKendra H. Tenny Crawoss, MD;  Location: [facility];  Service: Obstetrics;  Laterality: N/A;  . right ankle surgery     Social History   Occupational History  . Not on file.   Social History Main Topics  . Smoking status: Former Smoker     Packs/day: 0.50    Types: Cigarettes    Quit date: 04/02/2012  . Smokeless tobacco: Never Used  . Alcohol use No  . Drug use: No  . Sexual activity: Yes

## 2017-03-01 ENCOUNTER — Other Ambulatory Visit (INDEPENDENT_AMBULATORY_CARE_PROVIDER_SITE_OTHER): Payer: Self-pay | Admitting: Orthopaedic Surgery

## 2017-03-01 DIAGNOSIS — M25871 Other specified joint disorders, right ankle and foot: Secondary | ICD-10-CM

## 2017-03-02 NOTE — Patient Instructions (Signed)
Richelle ItoJackie A Slimp  03/02/2017   Your procedure is scheduled on: 03/10/17  [facility]   Report to admitting at    1230 PM   Call this number if you have problems the morning of surgery [phone]   Remember: ONLY 1 PERSON MAY GO WITH YOU TO SHORT STAY TO GET  READY MORNING OF YOUR SURGERY.  Do not eat food :After Midnight.  You may have clear liquids until 0900 am then nothing by mouth     Take these medicines the morning of surgery with A SIP OF WATER: Hydrocodone if needed                                You may not have any metal on your body including hair pins and              piercings  Do not wear jewelry, make-up, lotions, powders or perfumes, deodorant             Do not wear nail polish.  Do not shave  48 hours prior to surgery.  .   Do not bring valuables to the hospital. [facility] IS NOT             RESPONSIBLE   FOR VALUABLES.  Contacts, dentures or bridgework may not be worn into surgery.      Patients discharged the day of surgery will not be allowed to drive home.  Name and phone number of your driver:  Special Instructions: N/A              Please read over the following fact sheets you were given: _____________________________________________________________________          [facility] Pre[facility] Before surgery, you can play an important role.  Because skin is not sterile, your skin needs to be as free of germs as possible.  You can reduce the number of germs on your skin by washing with CHG (chlorahexidine gluconate) soap before surgery.  CHG is an antiseptic cleaner which kills germs and bonds with the skin to continue killing germs even after washing. Please DO NOT use if you have an allergy to CHG or antibacterial soaps.  If your skin becomes reddened/irritated stop using the CHG and inform your nurse when you arrive at Short Stay. Do not shave (including legs and underarms) for at least 48 hours  prior to the first CHG shower.  You may shave your face/neck. Please follow these instructions carefully:  1.  Shower with CHG Soap the night before surgery and the  morning of Surgery.  2.  If you choose to wash your hair, wash your hair first as usual with your  normal  shampoo.  3.  After you shampoo, rinse your hair and body thoroughly to remove the  shampoo.                           4.  Use CHG as you would any other liquid soap.  You can apply chg directly  to the skin and wash                       Gently with a scrungie or clean washcloth.  5.  Apply the CHG  Soap to your body ONLY FROM THE NECK DOWN.   Do not use on face/ open                           Wound or open sores. Avoid contact with eyes, ears mouth and genitals (private parts).                       Wash face,  Genitals (private parts) with your normal soap.             6.  Wash thoroughly, paying special attention to the area where your surgery  will be performed.  7.  Thoroughly rinse your body with warm water from the neck down.  8.  DO NOT shower/wash with your normal soap after using and rinsing off  the CHG Soap.                9.  Pat yourself dry with a clean towel.            10.  Wear clean pajamas.            11.  Place clean sheets on your bed the night of your first shower and do not  sleep with pets. Day of Surgery : Do not apply any lotions/deodorants the morning of surgery.  Please wear clean clothes to the hospital/surgery center.  FAILURE TO FOLLOW THESE INSTRUCTIONS MAY RESULT IN THE CANCELLATION OF YOUR SURGERY PATIENT SIGNATURE_________________________________  NURSE SIGNATURE__________________________________  ________________________________________________________________________    CLEAR LIQUID DIET till 0900 am then nothing by mouth   Foods Allowed                                                                     Foods Excluded  Coffee and tea, regular and decaf                              liquids that you cannot  Plain Jell-O in any flavor                                             see through such as: Fruit ices (not with fruit pulp)                                     milk, soups, orange juice  Iced Popsicles                                    All solid food Carbonated beverages, regular and diet                                    Cranberry, grape and apple juices Sports drinks like Gatorade Lightly seasoned clear broth or consume(fat free) Sugar, honey syrup  Sample Menu Breakfast  Lunch                                     Supper Cranberry juice                    Beef broth                            Chicken broth Jell-O                                     Grape juice                           Apple juice Coffee or tea                        Jell-O                                      Popsicle                                                Coffee or tea                        Coffee or tea  _____________________________________________________________________

## 2017-03-06 ENCOUNTER — Encounter (HOSPITAL_COMMUNITY): Payer: Self-pay

## 2017-03-06 ENCOUNTER — Encounter (HOSPITAL_COMMUNITY)
Admission: RE | Admit: 2017-03-06 | Discharge: 2017-03-06 | Disposition: A | Discharge status: Home / Self Care | Payer: Self-pay | Source: Ambulatory Visit | Attending: Orthopaedic Surgery | Admitting: Orthopaedic Surgery

## 2017-03-06 DIAGNOSIS — Z01812 Encounter for preprocedural laboratory examination: Secondary | ICD-10-CM | POA: Insufficient documentation

## 2017-03-06 DIAGNOSIS — M25871 Other specified joint disorders, right ankle and foot: Secondary | ICD-10-CM | POA: Insufficient documentation

## 2017-03-06 HISTORY — DX: Major depressive disorder, single episode, unspecified: F32.9

## 2017-03-06 HISTORY — DX: Unspecified osteoarthritis, unspecified site: M19.90

## 2017-03-06 HISTORY — DX: Depression, unspecified: F32.A

## 2017-03-06 HISTORY — DX: Anxiety disorder, unspecified: F41.9

## 2017-03-06 LAB — CBC
HEMATOCRIT: 41.9 % (ref 36.0–46.0)
Hemoglobin: 15.2 g/dL — ABNORMAL HIGH (ref 12.0–15.0)
MCH: 29.8 pg (ref 26.0–34.0)
MCHC: 36.3 g/dL — ABNORMAL HIGH (ref 30.0–36.0)
MCV: 82.2 fL (ref 78.0–100.0)
Platelets: 216 10*3/uL (ref 150–400)
RBC: 5.1 MIL/uL (ref 3.87–5.11)
RDW: 12.2 % (ref 11.5–15.5)
WBC: 7.5 10*3/uL (ref 4.0–10.5)

## 2017-03-06 LAB — HCG, SERUM, QUALITATIVE: Preg, Serum: POSITIVE — AB

## 2017-03-06 NOTE — Progress Notes (Signed)
I called patient and left voice mail for return call to discuss. 

## 2017-03-06 NOTE — Progress Notes (Signed)
Serum pregnancy routed to Dr. Maureen Ralphs and Waldo Laine Via epic.

## 2017-03-07 NOTE — Progress Notes (Signed)
I called and spoke to patient.  I advised her of positive pregnancy test result.  I advised I would cancel surgery for Friday.  We will wait to hear back from patient if she wishes to proceed in the future.

## 2017-03-10 ENCOUNTER — Ambulatory Visit (HOSPITAL_COMMUNITY): Admission: RE | Admit: 2017-03-10 | Payer: Self-pay | Source: Ambulatory Visit | Admitting: Orthopaedic Surgery

## 2017-03-10 ENCOUNTER — Encounter (HOSPITAL_COMMUNITY): Admission: RE | Payer: Self-pay | Source: Ambulatory Visit

## 2017-03-10 SURGERY — ARTHROSCOPY, ANKLE
Anesthesia: Choice | Laterality: Right

## 2017-03-16 ENCOUNTER — Inpatient Hospital Stay (INDEPENDENT_AMBULATORY_CARE_PROVIDER_SITE_OTHER): Payer: Self-pay | Admitting: Physician Assistant

## 2018-11-10 DIAGNOSIS — Z3A22 22 weeks gestation of pregnancy: Secondary | ICD-10-CM | POA: Insufficient documentation

## 2018-11-15 ENCOUNTER — Encounter: Payer: Self-pay | Admitting: *Deleted

## 2018-11-15 ENCOUNTER — Other Ambulatory Visit: Payer: Self-pay

## 2018-11-15 ENCOUNTER — Telehealth (INDEPENDENT_AMBULATORY_CARE_PROVIDER_SITE_OTHER): Payer: Medicaid Other | Admitting: *Deleted

## 2018-11-15 DIAGNOSIS — O210 Mild hyperemesis gravidarum: Secondary | ICD-10-CM

## 2018-11-15 DIAGNOSIS — O99320 Drug use complicating pregnancy, unspecified trimester: Secondary | ICD-10-CM

## 2018-11-15 DIAGNOSIS — F17200 Nicotine dependence, unspecified, uncomplicated: Secondary | ICD-10-CM | POA: Insufficient documentation

## 2018-11-15 DIAGNOSIS — O0930 Supervision of pregnancy with insufficient antenatal care, unspecified trimester: Secondary | ICD-10-CM | POA: Insufficient documentation

## 2018-11-15 DIAGNOSIS — Z3480 Encounter for supervision of other normal pregnancy, unspecified trimester: Secondary | ICD-10-CM | POA: Insufficient documentation

## 2018-11-15 MED ORDER — AMBULATORY NON FORMULARY MEDICATION: 1.0000 | 0 refills | Status: DC

## 2018-11-15 MED ORDER — PROMETHAZINE HCL 25 MG PO TABS: 25.0000 mg | ORAL_TABLET | Freq: Four times a day (QID) | ORAL | 1 refills | Status: DC | PRN

## 2018-11-15 NOTE — Progress Notes (Signed)
I connected with  Veronica Richardson on 11/15/18 at 10:15 AM EDT by telephone and verified that I am speaking with the correct person using two identifiers.   I discussed the limitations, risks, security and privacy concerns of performing an evaluation and management service by telephone and virtual and the availability of in person appointments. I also discussed with the patient that there may be a patient responsible charge related to this service. The patient expressed understanding and agreed to proceed.  I verified LMP was in November , not sure of date and confirmation of [place] per [facility]  Referral that her EDD is 03/12/19. I cannot access Care everywhere at this time.  She does have MyChart activated but canot access it- I gave her the phone number for assistance .  She c/o nausea/ morning sickness and requested medication. Phenergan ordered per standing orders. I informed her once we have access to her ultrasound we will schedule further [place] if needed.  She does not have a cuff and I informed her we will send her a blood pressure cuff and ask that she take her blood pressure weekly and enter into [employer] App. I assisted her with downloading the app.   Linda,RN 11/15/2018  10:11 AM

## 2018-11-15 NOTE — Progress Notes (Signed)
11:20 I had Dr.Ervin also look for [place] report and patient not in Care Everywhere.  I called Madysn and she had brought the Confirmation of EDD by [place] from [facility]. I asked her to come by our office to sign a release for [place] to get the [place] report asap so we would have it before her first visit. She states she can do that.

## 2018-11-15 NOTE — Progress Notes (Signed)
Agree with A & P. 

## 2018-11-17 ENCOUNTER — Encounter: Payer: Self-pay | Admitting: Student

## 2018-11-17 NOTE — Progress Notes (Signed)
Chart reviewed for nurse visit. Agree with plan of care.   Judeth Horn, NP 11/17/2018 7:52 AM

## 2018-11-28 ENCOUNTER — Telehealth: Payer: Self-pay | Admitting: Family Medicine

## 2018-11-28 NOTE — Telephone Encounter (Signed)
Called and left patient a detailed message bout upcoming appt. she has been instructed to wear a face mask, and no visitors are allowed at this time due to [place].

## 2018-12-03 ENCOUNTER — Encounter: Payer: Self-pay | Admitting: Obstetrics and Gynecology

## 2018-12-03 ENCOUNTER — Other Ambulatory Visit (HOSPITAL_COMMUNITY)
Admission: RE | Admit: 2018-12-03 | Discharge: 2018-12-03 | Disposition: A | Discharge status: Home / Self Care | Payer: Medicaid Other | Source: Ambulatory Visit | Attending: Obstetrics and Gynecology | Admitting: Obstetrics and Gynecology

## 2018-12-03 ENCOUNTER — Ambulatory Visit (INDEPENDENT_AMBULATORY_CARE_PROVIDER_SITE_OTHER): Payer: Medicaid Other | Admitting: Obstetrics and Gynecology

## 2018-12-03 ENCOUNTER — Other Ambulatory Visit: Payer: Self-pay

## 2018-12-03 VITALS — BP 107/66 | HR 81 | Wt 194.0 lb

## 2018-12-03 DIAGNOSIS — Z23 Encounter for immunization: Secondary | ICD-10-CM | POA: Diagnosis not present

## 2018-12-03 DIAGNOSIS — Z3480 Encounter for supervision of other normal pregnancy, unspecified trimester: Secondary | ICD-10-CM | POA: Diagnosis not present

## 2018-12-03 DIAGNOSIS — O219 Vomiting of pregnancy, unspecified: Secondary | ICD-10-CM | POA: Diagnosis present

## 2018-12-03 DIAGNOSIS — Z3A25 25 weeks gestation of pregnancy: Secondary | ICD-10-CM | POA: Diagnosis not present

## 2018-12-03 DIAGNOSIS — Z98891 History of uterine scar from previous surgery: Secondary | ICD-10-CM | POA: Insufficient documentation

## 2018-12-03 DIAGNOSIS — Z3009 Encounter for other general counseling and advice on contraception: Secondary | ICD-10-CM | POA: Insufficient documentation

## 2018-12-03 LAB — POCT URINALYSIS DIP (DEVICE)
Glucose, UA: NEGATIVE mg/dL
Hgb urine dipstick: NEGATIVE
Leukocytes,Ua: NEGATIVE
Nitrite: NEGATIVE
Protein, ur: NEGATIVE mg/dL
Specific Gravity, Urine: >=1.03 (ref 1.005–1.030)
Urobilinogen, UA: 1 mg/dL (ref 0.0–1.0)
pH: 6 (ref 5.0–8.0)

## 2018-12-03 MED ORDER — ONDANSETRON 4 MG PO TBDP: 4.0000 mg | ORAL_TABLET | Freq: Four times a day (QID) | ORAL | 2 refills | Status: DC | PRN

## 2018-12-03 NOTE — Patient Instructions (Signed)

## 2018-12-03 NOTE — Progress Notes (Signed)
Subjective:  Veronica Richardson is a 33 y.o. G3P2002 at [redacted]w[redacted]d being seen today for her first OB visit. Late to prenatal care. EDD by 22 week U/S ( report not available). H/O c section x 2. She is currently monitored for the following issues for this low-risk pregnancy and has Gingivitis; Supervision of other normal pregnancy, antepartum; Smoker; Substance abuse affecting pregnancy, antepartum; Late prenatal care affecting pregnancy; History of cesarean section; Unwanted fertility; and Nausea and vomiting in pregnancy on their problem list.  Patient reports nausea.  Contractions: Not present. Vag. Bleeding: None.  Movement: Present. Denies leaking of fluid.   The following portions of the patient's history were reviewed and updated as appropriate: allergies, current medications, past family history, past medical history, past social history, past surgical history and problem list. Problem list updated.  Objective:   Vitals:   12/03/18 1034  BP: 107/66  Pulse: 81  Weight: 194 lb (88 kg)    Fetal Status: Fetal Heart Rate (bpm): 147   Movement: Present     General:  Alert, oriented and cooperative. Patient is in no acute distress.  Skin: Skin is warm and dry. No rash noted.   Cardiovascular: Normal heart rate noted  Respiratory: Normal respiratory effort, no problems with respiration noted  Abdomen: Soft, gravid, appropriate for gestational age. Pain/Pressure: Present     Pelvic:  Cervical exam performed        Extremities: Normal range of motion.  Edema: None  Mental Status: Normal mood and affect. Normal behavior. Normal judgment and thought content.   Urinalysis:      Assessment and Plan:  Pregnancy: G3P2002 at [redacted]w[redacted]d  1. Supervision of other normal pregnancy, antepartum Prenatal care and labs reviewed with pt. Genetic testing reviewed Will have pt sign for release of medical records for U/S report - Cytology - PAP( [place]) - [place] MFM OB DETAIL +14 WK; Future - Genetic Screening -  Obstetric Panel, Including HIV - Culture, OB Urine - Hemoglobin A1c  2. History of cesarean section For repeat at 39 weeks  3. Unwanted fertility BTL papers signed today  4. Nausea and vomiting in pregnancy  - ondansetron (ZOFRAN ODT) 4 MG disintegrating tablet; Take 1 tablet (4 mg total) by mouth every 6 (six) hours as needed for nausea.  Dispense: 20 tablet; Refill: 2  Preterm labor symptoms and general obstetric precautions including but not limited to vaginal bleeding, contractions, leaking of fluid and fetal movement were reviewed in detail with the patient. Please refer to After Visit Summary for other counseling recommendations.  Return in about 2 weeks (around 12/17/2018) for OB visit, face to face [facility] only.   Chancy Milroy, MD

## 2018-12-03 NOTE — Progress Notes (Signed)
Home Medicaid Form Completed  BTL consent signed

## 2018-12-04 LAB — OBSTETRIC PANEL, INCLUDING HIV
Antibody Screen: NEGATIVE
Basophils Absolute: 0 10*3/uL (ref 0.0–0.2)
Basos: 0 %
EOS (ABSOLUTE): 0.1 10*3/uL (ref 0.0–0.4)
Eos: 1 %
HIV Screen 4th Generation wRfx: NONREACTIVE
Hematocrit: 37.1 % (ref 34.0–46.6)
Hemoglobin: 13 g/dL (ref 11.1–15.9)
Hepatitis B Surface Ag: NEGATIVE
Immature Grans (Abs): 0.2 10*3/uL — ABNORMAL HIGH (ref 0.0–0.1)
Immature Granulocytes: 1 %
Lymphocytes Absolute: 2.3 10*3/uL (ref 0.7–3.1)
Lymphs: 18 %
MCH: 29 pg (ref 26.6–33.0)
MCHC: 35 g/dL (ref 31.5–35.7)
MCV: 83 fL (ref 79–97)
Monocytes Absolute: 0.8 10*3/uL (ref 0.1–0.9)
Monocytes: 6 %
Neutrophils Absolute: 9.5 10*3/uL — ABNORMAL HIGH (ref 1.4–7.0)
Neutrophils: 74 %
Platelets: 216 10*3/uL (ref 150–450)
RBC: 4.48 x10E6/uL (ref 3.77–5.28)
RDW: 12.8 % (ref 11.7–15.4)
RPR Ser Ql: NONREACTIVE
Rh Factor: NEGATIVE
Rubella Antibodies, IGG: 1.61 index (ref >0.99)
WBC: 12.9 10*3/uL — ABNORMAL HIGH (ref 3.4–10.8)

## 2018-12-04 LAB — HEMOGLOBIN A1C
Est. average glucose Bld gHb Est-mCnc: 97 mg/dL
Hgb A1c MFr Bld: 5 % (ref 4.8–5.6)

## 2018-12-05 LAB — URINE CULTURE, OB REFLEX

## 2018-12-05 LAB — CULTURE, OB URINE

## 2018-12-06 LAB — CYTOLOGY - PAP
Chlamydia: NEGATIVE
Diagnosis: UNDETERMINED — AB
HPV: NOT DETECTED
Neisseria Gonorrhea: NEGATIVE

## 2018-12-07 ENCOUNTER — Other Ambulatory Visit (HOSPITAL_COMMUNITY): Payer: Self-pay | Admitting: *Deleted

## 2018-12-07 ENCOUNTER — Other Ambulatory Visit: Payer: Self-pay

## 2018-12-07 ENCOUNTER — Ambulatory Visit (HOSPITAL_COMMUNITY)
Admission: RE | Admit: 2018-12-07 | Discharge: 2018-12-07 | Disposition: A | Discharge status: Home / Self Care | Payer: Medicaid Other | Source: Ambulatory Visit | Attending: Obstetrics and Gynecology | Admitting: Obstetrics and Gynecology

## 2018-12-07 DIAGNOSIS — Z363 Encounter for antenatal screening for malformations: Secondary | ICD-10-CM | POA: Diagnosis not present

## 2018-12-07 DIAGNOSIS — Z3A28 28 weeks gestation of pregnancy: Secondary | ICD-10-CM

## 2018-12-07 DIAGNOSIS — O34219 Maternal care for unspecified type scar from previous cesarean delivery: Secondary | ICD-10-CM

## 2018-12-07 DIAGNOSIS — Z362 Encounter for other antenatal screening follow-up: Secondary | ICD-10-CM

## 2018-12-07 DIAGNOSIS — Z3480 Encounter for supervision of other normal pregnancy, unspecified trimester: Secondary | ICD-10-CM | POA: Diagnosis not present

## 2018-12-12 ENCOUNTER — Encounter: Payer: Self-pay | Admitting: *Deleted

## 2018-12-17 ENCOUNTER — Other Ambulatory Visit: Payer: Self-pay

## 2018-12-17 ENCOUNTER — Ambulatory Visit (INDEPENDENT_AMBULATORY_CARE_PROVIDER_SITE_OTHER): Payer: Medicaid Other | Admitting: Obstetrics and Gynecology

## 2018-12-17 ENCOUNTER — Other Ambulatory Visit: Payer: Medicaid Other

## 2018-12-17 VITALS — BP 105/69 | HR 76 | Temp 97.8°F | Wt 194.8 lb

## 2018-12-17 DIAGNOSIS — Z3009 Encounter for other general counseling and advice on contraception: Secondary | ICD-10-CM

## 2018-12-17 DIAGNOSIS — Z3483 Encounter for supervision of other normal pregnancy, third trimester: Secondary | ICD-10-CM

## 2018-12-17 DIAGNOSIS — Z3480 Encounter for supervision of other normal pregnancy, unspecified trimester: Secondary | ICD-10-CM

## 2018-12-17 DIAGNOSIS — Z3A30 30 weeks gestation of pregnancy: Secondary | ICD-10-CM

## 2018-12-17 DIAGNOSIS — Z98891 History of uterine scar from previous surgery: Secondary | ICD-10-CM

## 2018-12-17 NOTE — Progress Notes (Signed)
Subjective:  Veronica Richardson is a 32 y.o. G3P2002 at [redacted]w[redacted]d being seen today for ongoing prenatal care.  She is currently monitored for the following issues for this low-risk pregnancy and has Gingivitis; Supervision of other normal pregnancy, antepartum; Smoker; Substance abuse affecting pregnancy, antepartum; Late prenatal care affecting pregnancy; History of cesarean section; Unwanted fertility; and Nausea and vomiting in pregnancy on their problem list.  Patient reports no complaints.  Contractions: Not present. Vag. Bleeding: None.  Movement: Present. Denies leaking of fluid.   The following portions of the patient's history were reviewed and updated as appropriate: allergies, current medications, past family history, past medical history, past social history, past surgical history and problem list. Problem list updated.  Objective:   Vitals:   12/17/18 1000  BP: 105/69  Pulse: 76  Temp: 97.8 F (36.6 C)  Weight: 194 lb 12.8 oz (88.4 kg)    Fetal Status: Fetal Heart Rate (bpm): 143   Movement: Present     General:  Alert, oriented and cooperative. Patient is in no acute distress.  Skin: Skin is warm and dry. No rash noted.   Cardiovascular: Normal heart rate noted  Respiratory: Normal respiratory effort, no problems with respiration noted  Abdomen: Soft, gravid, appropriate for gestational age. Pain/Pressure: Present     Pelvic:  Cervical exam deferred        Extremities: Normal range of motion.  Edema: Trace  Mental Status: Normal mood and affect. Normal behavior. Normal judgment and thought content.   Urinalysis:      Assessment and Plan:  Pregnancy: G3P2002 at [redacted]w[redacted]d  1. Supervision of other normal pregnancy, antepartum Glucola today EDD changed by U/S  2. History of cesarean section For repeat at 39 weeks  3. Unwanted fertility BTL papers signed  Preterm labor symptoms and general obstetric precautions including but not limited to vaginal bleeding, contractions,  leaking of fluid and fetal movement were reviewed in detail with the patient. Please refer to After Visit Summary for other counseling recommendations.  Return in about 4 weeks (around 01/14/2019) for OB visit, virtual.   Chancy Milroy, MD

## 2018-12-17 NOTE — Progress Notes (Unsigned)
2hr

## 2018-12-17 NOTE — Patient Instructions (Signed)
Third Trimester of Pregnancy The third trimester is from week 28 through week 40 (months 7 through 9). The third trimester is a time when the unborn baby (fetus) is growing rapidly. At the end of the ninth month, the fetus is about 20 inches in length and weighs 6-10 pounds. Body changes during your third trimester Your body will continue to go through many changes during pregnancy. The changes vary from woman to woman. During the third trimester:  Your weight will continue to increase. You can expect to gain 25-35 pounds (11-16 kg) by the end of the pregnancy.  You may begin to get stretch marks on your hips, abdomen, and breasts.  You may urinate more often because the fetus is moving lower into your pelvis and pressing on your bladder.  You may develop or continue to have heartburn. This is caused by increased hormones that slow down muscles in the digestive tract.  You may develop or continue to have constipation because increased hormones slow digestion and cause the muscles that push waste through your intestines to relax.  You may develop hemorrhoids. These are swollen veins (varicose veins) in the rectum that can itch or be painful.  You may develop swollen, bulging veins (varicose veins) in your legs.  You may have increased body aches in the pelvis, back, or thighs. This is due to weight gain and increased hormones that are relaxing your joints.  You may have changes in your hair. These can include thickening of your hair, rapid growth, and changes in texture. Some women also have hair loss during or after pregnancy, or hair that feels dry or thin. Your hair will most likely return to normal after your baby is born.  Your breasts will continue to grow and they will continue to become tender. A yellow fluid (colostrum) may leak from your breasts. This is the first milk you are producing for your baby.  Your belly button may stick out.  You may notice more swelling in your hands,  face, or ankles.  You may have increased tingling or numbness in your hands, arms, and legs. The skin on your belly may also feel numb.  You may feel short of breath because of your expanding uterus.  You may have more problems sleeping. This can be caused by the size of your belly, increased need to urinate, and an increase in your body's metabolism.  You may notice the fetus "dropping," or moving lower in your abdomen (lightening).  You may have increased vaginal discharge.  You may notice your joints feel loose and you may have pain around your pelvic bone. What to expect at prenatal visits You will have prenatal exams every 2 weeks until week 36. Then you will have weekly prenatal exams. During a routine prenatal visit:  You will be weighed to make sure you and the baby are growing normally.  Your blood pressure will be taken.  Your abdomen will be measured to track your baby's growth.  The fetal heartbeat will be listened to.  Any test results from the previous visit will be discussed.  You may have a cervical check near your due date to see if your cervix has softened or thinned (effaced).  You will be tested for Group B streptococcus. This happens between 35 and 37 weeks. Your health care provider may ask you:  What your birth plan is.  How you are feeling.  If you are feeling the baby move.  If you have had any abnormal   symptoms, such as leaking fluid, bleeding, severe headaches, or abdominal cramping.  If you are using any tobacco products, including cigarettes, chewing tobacco, and electronic cigarettes.  If you have any questions. Other tests or screenings that may be performed during your third trimester include:  Blood tests that check for low iron levels (anemia).  Fetal testing to check the health, activity level, and growth of the fetus. Testing is done if you have certain medical conditions or if there are problems during the pregnancy.  Nonstress test  (NST). This test checks the health of your baby to make sure there are no signs of problems, such as the baby not getting enough oxygen. During this test, a belt is placed around your belly. The baby is made to move, and its heart rate is monitored during movement. What is false labor? False labor is a condition in which you feel small, irregular tightenings of the muscles in the womb (contractions) that usually go away with rest, changing position, or drinking water. These are called Braxton Hicks contractions. Contractions may last for hours, days, or even weeks before true labor sets in. If contractions come at regular intervals, become more frequent, increase in intensity, or become painful, you should see your health care provider. What are the signs of labor?  Abdominal cramps.  Regular contractions that start at 10 minutes apart and become stronger and more frequent with time.  Contractions that start on the top of the uterus and spread down to the lower abdomen and back.  Increased pelvic pressure and dull back pain.  A watery or bloody mucus discharge that comes from the vagina.  Leaking of amniotic fluid. This is also known as your "water breaking." It could be a slow trickle or a gush. Let your health care provider know if it has a color or strange odor. If you have any of these signs, call your health care provider right away, even if it is before your due date. Follow these instructions at home: Medicines  Follow your health care provider's instructions regarding medicine use. Specific medicines may be either safe or unsafe to take during pregnancy.  Take a prenatal vitamin that contains at least 600 micrograms (mcg) of folic acid.  If you develop constipation, try taking a stool softener if your health care provider approves. Eating and drinking   Eat a balanced diet that includes fresh fruits and vegetables, whole grains, good sources of protein such as meat, eggs, or tofu,  and low-fat dairy. Your health care provider will help you determine the amount of weight gain that is right for you.  Avoid raw meat and uncooked cheese. These carry germs that can cause birth defects in the baby.  If you have low calcium intake from food, talk to your health care provider about whether you should take a daily calcium supplement.  Eat four or five small meals rather than three large meals a day.  Limit foods that are high in fat and processed sugars, such as fried and sweet foods.  To prevent constipation: ? Drink enough fluid to keep your urine clear or pale yellow. ? Eat foods that are high in fiber, such as fresh fruits and vegetables, whole grains, and beans. Activity  Exercise only as directed by your health care provider. Most women can continue their usual exercise routine during pregnancy. Try to exercise for 30 minutes at least 5 days a week. Stop exercising if you experience uterine contractions.  Avoid heavy lifting.  Do   not exercise in extreme heat or humidity, or at high altitudes.  Wear low-heel, comfortable shoes.  Practice good posture.  You may continue to have sex unless your health care provider tells you otherwise. Relieving pain and discomfort  Take frequent breaks and rest with your legs elevated if you have leg cramps or low back pain.  Take warm sitz baths to soothe any pain or discomfort caused by hemorrhoids. Use hemorrhoid cream if your health care provider approves.  Wear a good support bra to prevent discomfort from breast tenderness.  If you develop varicose veins: ? Wear support pantyhose or compression stockings as told by your healthcare provider. ? Elevate your feet for 15 minutes, 3-4 times a day. Prenatal care  Write down your questions. Take them to your prenatal visits.  Keep all your prenatal visits as told by your health care provider. This is important. Safety  Wear your seat belt at all times when driving.  Make  a list of emergency phone numbers, including numbers for family, friends, the hospital, and police and fire departments. General instructions  Avoid cat litter boxes and soil used by cats. These carry germs that can cause birth defects in the baby. If you have a cat, ask someone to clean the litter box for you.  Do not travel far distances unless it is absolutely necessary and only with the approval of your health care provider.  Do not use hot tubs, steam rooms, or saunas.  Do not drink alcohol.  Do not use any products that contain nicotine or tobacco, such as cigarettes and e-cigarettes. If you need help quitting, ask your health care provider.  Do not use any medicinal herbs or unprescribed drugs. These chemicals affect the formation and growth of the baby.  Do not douche or use tampons or scented sanitary pads.  Do not cross your legs for long periods of time.  To prepare for the arrival of your baby: ? Take prenatal classes to understand, practice, and ask questions about labor and delivery. ? Make a trial run to the hospital. ? Visit the hospital and tour the maternity area. ? Arrange for maternity or paternity leave through employers. ? Arrange for family and friends to take care of pets while you are in the hospital. ? Purchase a rear-facing car seat and make sure you know how to install it in your car. ? Pack your hospital bag. ? Prepare the baby's nursery. Make sure to remove all pillows and stuffed animals from the baby's crib to prevent suffocation.  Visit your dentist if you have not gone during your pregnancy. Use a soft toothbrush to brush your teeth and be gentle when you floss. Contact a health care provider if:  You are unsure if you are in labor or if your water has broken.  You become dizzy.  You have mild pelvic cramps, pelvic pressure, or nagging pain in your abdominal area.  You have lower back pain.  You have persistent nausea, vomiting, or diarrhea.   You have an unusual or bad smelling vaginal discharge.  You have pain when you urinate. Get help right away if:  Your water breaks before 37 weeks.  You have regular contractions less than 5 minutes apart before 37 weeks.  You have a fever.  You are leaking fluid from your vagina.  You have spotting or bleeding from your vagina.  You have severe abdominal pain or cramping.  You have rapid weight loss or weight gain.  You have   shortness of breath with chest pain.  You notice sudden or extreme swelling of your face, hands, ankles, feet, or legs.  Your baby makes fewer than 10 movements in 2 hours.  You have severe headaches that do not go away when you take medicine.  You have vision changes. Summary  The third trimester is from week 28 through week 40, months 7 through 9. The third trimester is a time when the unborn baby (fetus) is growing rapidly.  During the third trimester, your discomfort may increase as you and your baby continue to gain weight. You may have abdominal, leg, and back pain, sleeping problems, and an increased need to urinate.  During the third trimester your breasts will keep growing and they will continue to become tender. A yellow fluid (colostrum) may leak from your breasts. This is the first milk you are producing for your baby.  False labor is a condition in which you feel small, irregular tightenings of the muscles in the womb (contractions) that eventually go away. These are called Braxton Hicks contractions. Contractions may last for hours, days, or even weeks before true labor sets in.  Signs of labor can include: abdominal cramps; regular contractions that start at 10 minutes apart and become stronger and more frequent with time; watery or bloody mucus discharge that comes from the vagina; increased pelvic pressure and dull back pain; and leaking of amniotic fluid. This information is not intended to replace advice given to you by your health  care provider. Make sure you discuss any questions you have with your health care provider. Document Released: 05/31/2001 Document Revised: 09/27/2018 Document Reviewed: 07/12/2016 Elsevier Patient Education  2020 [employer]  

## 2018-12-18 LAB — GLUCOSE TOLERANCE, 2 HOURS W/ 1HR
Glucose, 1 hour: 192 mg/dL — ABNORMAL HIGH (ref 65–179)
Glucose, 2 hour: 107 mg/dL (ref 65–152)
Glucose, Fasting: 85 mg/dL (ref 65–91)

## 2018-12-20 ENCOUNTER — Telehealth: Payer: Self-pay | Admitting: *Deleted

## 2018-12-20 NOTE — Telephone Encounter (Signed)
Called pt with results of bloodwork and to inform her she has GDM.  Pt did not pick up and the message, we're sorry but your call cannot be completed as dialed was received.  Message sent to front office staff to schedule pt for diabetic education.

## 2018-12-20 NOTE — Telephone Encounter (Signed)
-----   Message from Chancy Milroy, MD sent at 12/19/2018  1:14 PM EDT ----- Please let pt know that she has GDM Please refer pt for diabetic education Thanks Mihcael

## 2018-12-24 ENCOUNTER — Telehealth: Payer: Self-pay | Admitting: General Practice

## 2018-12-24 DIAGNOSIS — O24410 Gestational diabetes mellitus in pregnancy, diet controlled: Secondary | ICD-10-CM

## 2018-12-24 MED ORDER — ACCU-CHEK GUIDE VI STRP: ORAL_STRIP | 12 refills | Status: DC

## 2018-12-24 MED ORDER — ACCU-CHEK GUIDE W/DEVICE KIT: 1.0000 | PACK | Freq: Once | 0 refills | Status: AC

## 2018-12-24 MED ORDER — ACCU-CHEK FASTCLIX LANCETS MISC: 1.0000 | Freq: Four times a day (QID) | 12 refills | Status: DC

## 2018-12-24 NOTE — Telephone Encounter (Signed)
Called pt with results of bloodwork and to inform her that she has GDM.  Pt did not pick up and after ringing several times, the message "we're sorry but your call cannot be completed as dialed" was received.  Mychart message sent.

## 2018-12-24 NOTE — Telephone Encounter (Signed)
Patient called and left message on nurse voicemail line stating she is calling about her sugar test results. Called patient and she states she received her test results and was told she needs an appt. Told patient she needs an appt to meet with our diabetes educator. Scheduled an appt 7/9 @ 3pm with [place] and informed patient. Also discussed meter, strips & lancets sent to pharmacy and added blood sugar platform to [place]. Patient verbalized understanding to all & had no questions.

## 2018-12-27 ENCOUNTER — Telehealth (INDEPENDENT_AMBULATORY_CARE_PROVIDER_SITE_OTHER): Payer: Medicaid Other

## 2018-12-27 ENCOUNTER — Ambulatory Visit: Payer: Medicaid Other | Admitting: Registered"

## 2018-12-27 DIAGNOSIS — Z7189 Other specified counseling: Secondary | ICD-10-CM

## 2018-12-27 NOTE — Telephone Encounter (Signed)
Pt called and stated that she needed to reschedule her appt for Diabetes Education due to transportation issues. I explained to the pt that she can the office and reschedule the appt.  Pt verbalized understanding.

## 2019-01-02 ENCOUNTER — Encounter: Payer: Self-pay | Admitting: Registered"

## 2019-01-02 ENCOUNTER — Other Ambulatory Visit: Payer: Self-pay

## 2019-01-02 ENCOUNTER — Encounter: Payer: Medicaid Other | Attending: Obstetrics and Gynecology | Admitting: Registered"

## 2019-01-02 DIAGNOSIS — O99810 Abnormal glucose complicating pregnancy: Secondary | ICD-10-CM | POA: Diagnosis not present

## 2019-01-02 NOTE — Progress Notes (Signed)
Patient was seen on 01/02/2019 for Gestational Diabetes self-management class at the [facility]. The following learning objectives were met by the patient during this course:   States the definition of Gestational Diabetes  States why dietary management is important in controlling blood glucose  Describes the effects each nutrient has on blood glucose levels  Demonstrates ability to create a balanced meal plan  Demonstrates carbohydrate counting   States when to check blood glucose levels  Demonstrates proper blood glucose monitoring techniques  States the effect of stress and exercise on blood glucose levels  States the importance of limiting caffeine and abstaining from alcohol and smoking  Blood glucose monitor given: Accu-chek Guide Me Lot # [id number] Exp: 03/13/20 Blood glucose reading: 93 mg/dL  Patient instructed to monitor glucose levels: FBS: 60 - <95; 1 hour: <140; 2 hour: <120  Patient received handouts:  Nutrition Diabetes and Pregnancy, including carb counting list  Patient will be seen for follow-up as needed.

## 2019-01-03 ENCOUNTER — Telehealth: Payer: Self-pay | Admitting: Obstetrics & Gynecology

## 2019-01-03 NOTE — Telephone Encounter (Signed)
Case manager called to be sure she could not attend the appointments with the patient. Informed the mgr no, she can not. She called in to leave the information with our office.  Duffy Rhody case manager for patient via the case management for high risk pregnancy through [facility]. Office number is [phone] and Cell number [phone].

## 2019-01-04 ENCOUNTER — Encounter (HOSPITAL_COMMUNITY): Payer: Self-pay

## 2019-01-04 ENCOUNTER — Other Ambulatory Visit: Payer: Self-pay

## 2019-01-04 ENCOUNTER — Ambulatory Visit (HOSPITAL_COMMUNITY): Payer: Medicaid Other | Admitting: *Deleted

## 2019-01-04 ENCOUNTER — Ambulatory Visit (HOSPITAL_COMMUNITY)
Admission: RE | Admit: 2019-01-04 | Discharge: 2019-01-04 | Disposition: A | Discharge status: Home / Self Care | Payer: Medicaid Other | Source: Ambulatory Visit | Attending: Obstetrics and Gynecology | Admitting: Obstetrics and Gynecology

## 2019-01-04 VITALS — BP 109/68 | HR 71 | Temp 98.7°F

## 2019-01-04 DIAGNOSIS — O34219 Maternal care for unspecified type scar from previous cesarean delivery: Secondary | ICD-10-CM

## 2019-01-04 DIAGNOSIS — Z362 Encounter for other antenatal screening follow-up: Secondary | ICD-10-CM

## 2019-01-04 DIAGNOSIS — Z3A32 32 weeks gestation of pregnancy: Secondary | ICD-10-CM

## 2019-01-04 DIAGNOSIS — O24410 Gestational diabetes mellitus in pregnancy, diet controlled: Secondary | ICD-10-CM | POA: Insufficient documentation

## 2019-01-07 ENCOUNTER — Telehealth: Payer: Self-pay

## 2019-01-07 NOTE — Telephone Encounter (Signed)
Called pt to make aware that Dr. Durward Fortes that she have another Pap w/co HPV in a year, no answer,left VM to call back for results. Advised also will send message thru My Chart.

## 2019-01-14 ENCOUNTER — Telehealth: Payer: Medicaid Other | Admitting: Advanced Practice Midwife

## 2019-01-14 ENCOUNTER — Encounter: Payer: Medicaid Other | Admitting: Family Medicine

## 2019-01-14 ENCOUNTER — Encounter: Payer: Self-pay | Admitting: Family Medicine

## 2019-01-14 NOTE — Progress Notes (Signed)
Patient did not keep appointment today. She will be called to reschedule.  

## 2019-01-16 ENCOUNTER — Telehealth: Payer: Self-pay | Admitting: General Practice

## 2019-01-16 NOTE — Telephone Encounter (Signed)
      Called patient regarding elevated fasting blood sugars. Reviewed with Dr Kennon Rounds who states patient should eat a protein with dinner and a snack afterwards to help with fasting. Patient should also walk 10 minutes after every meal. Reviewed with patient and she states she didn't realize those were elevated, she just thought it had to be below 120. Told patient we want her fastings below 95. Patient verbalized understanding and will follow recommendations. Patient had no questions.

## 2019-01-24 ENCOUNTER — Other Ambulatory Visit (HOSPITAL_COMMUNITY)
Admission: RE | Admit: 2019-01-24 | Discharge: 2019-01-24 | Disposition: A | Discharge status: Home / Self Care | Payer: Medicaid Other | Source: Ambulatory Visit | Attending: Obstetrics & Gynecology | Admitting: Obstetrics & Gynecology

## 2019-01-24 ENCOUNTER — Other Ambulatory Visit: Payer: Self-pay

## 2019-01-24 ENCOUNTER — Encounter: Payer: Self-pay | Admitting: General Practice

## 2019-01-24 ENCOUNTER — Ambulatory Visit (INDEPENDENT_AMBULATORY_CARE_PROVIDER_SITE_OTHER): Payer: Medicaid Other | Admitting: Obstetrics & Gynecology

## 2019-01-24 VITALS — BP 110/67 | HR 71 | Wt 201.1 lb

## 2019-01-24 DIAGNOSIS — Z3A35 35 weeks gestation of pregnancy: Secondary | ICD-10-CM

## 2019-01-24 DIAGNOSIS — Z3480 Encounter for supervision of other normal pregnancy, unspecified trimester: Secondary | ICD-10-CM | POA: Insufficient documentation

## 2019-01-24 DIAGNOSIS — Z3483 Encounter for supervision of other normal pregnancy, third trimester: Secondary | ICD-10-CM

## 2019-01-24 NOTE — Patient Instructions (Signed)
Cesarean Delivery Cesarean birth, or cesarean delivery, is the surgical delivery of a baby through an incision in the abdomen and the uterus. This may be referred to as a C-section. This procedure may be scheduled ahead of time, or it may be done in an emergency situation. Tell a health care provider about:  Any allergies you have.  All medicines you are taking, including vitamins, herbs, eye drops, creams, and over-the-counter medicines.  Any problems you or family members have had with anesthetic medicines.  Any blood disorders you have.  Any surgeries you have had.  Any medical conditions you have.  Whether you or any members of your family have a history of deep vein thrombosis (DVT) or pulmonary embolism (PE). What are the risks? Generally, this is a safe procedure. However, problems may occur, including:  Infection.  Bleeding.  Allergic reactions to medicines.  Damage to other structures or organs.  Blood clots.  Injury to your baby. What happens before the procedure? General instructions  Follow instructions from your health care provider about eating or drinking restrictions.  If you know that you are going to have a cesarean delivery, do not shave your pubic area. Shaving before the procedure may increase your risk of infection.  Plan to have someone take you home from the hospital.  Ask your health care provider what steps will be taken to prevent infection. These may include: ? Removing hair at the surgery site. ? Washing skin with a germ-killing soap. ? Taking antibiotic medicine.  Depending on the reason for your cesarean delivery, you may have a physical exam or additional testing, such as an ultrasound.  You may have your blood or urine tested. Questions for your health care provider  Ask your health care provider about: ? Changing or stopping your regular medicines. This is especially important if you are taking diabetes medicines or blood thinners.  ? Your pain management plan. This is especially important if you plan to breastfeed your baby. ? How long you will be in the hospital after the procedure. ? Any concerns you may have about receiving blood products, if you need them during the procedure. ? Cord blood banking, if you plan to collect your baby's umbilical cord blood.  You may also want to ask your health care provider: ? Whether you will be able to hold or breastfeed your baby while you are still in the operating room. ? Whether your baby can stay with you immediately after the procedure and during your recovery. ? Whether a family member or a person of your choice can go with you into the operating room and stay with you during the procedure, immediately after the procedure, and during your recovery. What happens during the procedure?   An IV will be inserted into one of your veins.  Fluid and medicines, such as antibiotics, will be given before the surgery.  Fetal monitors will be placed on your abdomen to check your baby's heart rate.  You may be given a special warming gown to wear to keep your temperature stable.  A catheter may be inserted into your bladder through your urethra. This drains your urine during the procedure.  You may be given one or more of the following: ? A medicine to numb the area (local anesthetic). ? A medicine to make you fall asleep (general anesthetic). ? A medicine (regional anesthetic) that is injected into your back or through a small thin tube placed in your back (spinal anesthetic or epidural anesthetic).   This numbs everything below the injection site and allows you to stay awake during your procedure. If this makes you feel nauseous, tell your health care provider. Medicines will be available to help reduce any nausea you may feel.  An incision will be made in your abdomen, and then in your uterus.  If you are awake during your procedure, you may feel tugging and pulling in your abdomen,  but you should not feel pain. If you feel pain, tell your health care provider immediately.  Your baby will be removed from your uterus. You may feel more pressure or pushing while this happens.  Immediately after birth, your baby will be dried and kept warm. You may be able to hold and breastfeed your baby.  The umbilical cord may be clamped and cut during this time. This usually occurs after waiting a period of 1-2 minutes after delivery.  Your placenta will be removed from your uterus.  Your incisions will be closed with stitches (sutures). Staples, skin glue, or adhesive strips may also be applied to the incision in your abdomen.  Bandages (dressings) may be placed over the incision in your abdomen. The procedure may vary among health care providers and hospitals. What happens after the procedure?  Your blood pressure, heart rate, breathing rate, and blood oxygen level will be monitored until you are discharged from the hospital.  You may continue to receive fluids and medicines through an IV.  You will have some pain. Medicines will be available to help control your pain.  To help prevent blood clots: ? You may be given medicines. ? You may have to wear compression stockings or devices. ? You will be encouraged to walk around when you are able.  Hospital staff will encourage and support bonding with your baby. Your hospital may have you and your baby to stay in the same room (rooming in) during your hospital stay to encourage successful bonding and breastfeeding.  You may be encouraged to cough and breathe deeply often. This helps to prevent lung problems.  If you have a catheter draining your urine, it will be removed as soon as possible after your procedure. Summary  Cesarean birth, or cesarean delivery, is the surgical delivery of a baby through an incision in the abdomen and the uterus.  Follow instructions from your health care provider about eating or drinking  restrictions before the procedure.  You will have some pain after the procedure. Medicines will be available to help control your pain.  Hospital staff will encourage and support bonding with your baby after the procedure. Your hospital may have you and your baby to stay in the same room (rooming in) during your hospital stay to encourage successful bonding and breastfeeding. This information is not intended to replace advice given to you by your health care provider. Make sure you discuss any questions you have with your health care provider. Document Released: 06/06/2005 Document Revised: 12/11/2017 Document Reviewed: 12/11/2017 Elsevier Patient Education  2020 [employer].  

## 2019-01-24 NOTE — Progress Notes (Signed)
   PRENATAL VISIT NOTE  Subjective:  Veronica Richardson is a 33 y.o. G3P2002 at [redacted]w[redacted]d being seen today for ongoing prenatal care.  She is currently monitored for the following issues for this high-risk pregnancy and has Gingivitis; Supervision of other normal pregnancy, antepartum; Smoker; Substance abuse affecting pregnancy, antepartum; Late prenatal care affecting pregnancy; History of cesarean section; Unwanted fertility; and Nausea and vomiting in pregnancy on their problem list.  Patient reports no complaints.  Contractions: Not present. Vag. Bleeding: None.  Movement: Present. Denies leaking of fluid.   The following portions of the patient's history were reviewed and updated as appropriate: allergies, current medications, past family history, past medical history, past social history, past surgical history and problem list.   Objective:   Vitals:   01/24/19 1026  BP: 110/67  Pulse: 71  Weight: 201 lb 1.6 oz (91.2 kg)    Fetal Status: Fetal Heart Rate (bpm): 142   Movement: Present     General:  Alert, oriented and cooperative. Patient is in no acute distress.  Skin: Skin is warm and dry. No rash noted.   Cardiovascular: Normal heart rate noted  Respiratory: Normal respiratory effort, no problems with respiration noted  Abdomen: Soft, gravid, appropriate for gestational age.  Pain/Pressure: Present     Pelvic: Cervical exam deferred        Extremities: Normal range of motion.  Edema: Trace  Mental Status: Normal mood and affect. Normal behavior. Normal judgment and thought content.   Assessment and Plan:  Pregnancy: G3P2002 at [redacted]w[redacted]d 1. Supervision of other normal pregnancy, antepartum Routine labs - Culture, beta strep (group b only) - GC/Chlamydia probe amp ([place])not at [facility] - [place] MFM OB FOLLOW UP; Future FBS sometime out of range but may not be true fasting Preterm labor symptoms and general obstetric precautions including but not limited to vaginal bleeding,  contractions, leaking of fluid and fetal movement were reviewed in detail with the patient. Please refer to After Visit Summary for other counseling recommendations.   Return in about 1 week (around 01/31/2019) for virtual is ok.  Future Appointments  Date Time Provider [place]  01/31/2019  8:15 AM Sloan Leiter, MD [facility]    Emeterio Reeve, MD

## 2019-01-24 NOTE — Progress Notes (Signed)
Glucose Readings in BRx:

## 2019-01-25 LAB — GC/CHLAMYDIA PROBE AMP (~~LOC~~) NOT AT ARMC
Chlamydia: NEGATIVE
Neisseria Gonorrhea: NEGATIVE

## 2019-01-28 ENCOUNTER — Other Ambulatory Visit: Payer: Self-pay | Admitting: Obstetrics & Gynecology

## 2019-01-28 LAB — CULTURE, BETA STREP (GROUP B ONLY): Strep Gp B Culture: NEGATIVE

## 2019-01-29 ENCOUNTER — Encounter: Payer: Self-pay | Admitting: Obstetrics and Gynecology

## 2019-01-29 DIAGNOSIS — O24419 Gestational diabetes mellitus in pregnancy, unspecified control: Secondary | ICD-10-CM | POA: Insufficient documentation

## 2019-01-30 ENCOUNTER — Ambulatory Visit (HOSPITAL_COMMUNITY)
Admission: RE | Admit: 2019-01-30 | Discharge: 2019-01-30 | Disposition: A | Discharge status: Home / Self Care | Payer: Medicaid Other | Source: Ambulatory Visit | Attending: Obstetrics and Gynecology | Admitting: Obstetrics and Gynecology

## 2019-01-30 ENCOUNTER — Other Ambulatory Visit: Payer: Self-pay

## 2019-01-30 DIAGNOSIS — O34219 Maternal care for unspecified type scar from previous cesarean delivery: Secondary | ICD-10-CM | POA: Diagnosis not present

## 2019-01-30 DIAGNOSIS — O24410 Gestational diabetes mellitus in pregnancy, diet controlled: Secondary | ICD-10-CM

## 2019-01-30 DIAGNOSIS — O99333 Smoking (tobacco) complicating pregnancy, third trimester: Secondary | ICD-10-CM | POA: Diagnosis not present

## 2019-01-30 DIAGNOSIS — Z362 Encounter for other antenatal screening follow-up: Secondary | ICD-10-CM | POA: Diagnosis not present

## 2019-01-30 DIAGNOSIS — O0933 Supervision of pregnancy with insufficient antenatal care, third trimester: Secondary | ICD-10-CM

## 2019-01-30 DIAGNOSIS — Z3480 Encounter for supervision of other normal pregnancy, unspecified trimester: Secondary | ICD-10-CM | POA: Diagnosis not present

## 2019-01-30 DIAGNOSIS — Z3A36 36 weeks gestation of pregnancy: Secondary | ICD-10-CM

## 2019-01-31 ENCOUNTER — Telehealth (INDEPENDENT_AMBULATORY_CARE_PROVIDER_SITE_OTHER): Payer: Medicaid Other | Admitting: Obstetrics and Gynecology

## 2019-01-31 ENCOUNTER — Encounter: Payer: Self-pay | Admitting: Obstetrics and Gynecology

## 2019-01-31 VITALS — BP 128/77 | HR 78

## 2019-01-31 DIAGNOSIS — Z98891 History of uterine scar from previous surgery: Secondary | ICD-10-CM | POA: Diagnosis not present

## 2019-01-31 DIAGNOSIS — O99323 Drug use complicating pregnancy, third trimester: Secondary | ICD-10-CM | POA: Diagnosis not present

## 2019-01-31 DIAGNOSIS — Z3009 Encounter for other general counseling and advice on contraception: Secondary | ICD-10-CM | POA: Diagnosis not present

## 2019-01-31 DIAGNOSIS — Z3480 Encounter for supervision of other normal pregnancy, unspecified trimester: Secondary | ICD-10-CM

## 2019-01-31 DIAGNOSIS — O24419 Gestational diabetes mellitus in pregnancy, unspecified control: Secondary | ICD-10-CM | POA: Diagnosis not present

## 2019-01-31 DIAGNOSIS — Z3A36 36 weeks gestation of pregnancy: Secondary | ICD-10-CM

## 2019-01-31 DIAGNOSIS — O99320 Drug use complicating pregnancy, unspecified trimester: Secondary | ICD-10-CM

## 2019-01-31 NOTE — Progress Notes (Signed)
   [place] AUDIO VISIT ENCOUNTER NOTE  Provider location: [facility] [employer] [facility]   I connected with Lora Paula Garlick on 01/31/19 at  8:15 AM EDT by MyChart Video Encounter without video at home and verified that I am speaking with the correct person using two identifiers.   I discussed the limitations, risks, security and privacy concerns of performing an evaluation and management service virtually and the availability of in person appointments. I also discussed with the patient that there may be a patient responsible charge related to this service. The patient expressed understanding and agreed to proceed. Subjective:  Veronica Richardson is a 33 y.o. G3P2002 at [redacted]w[redacted]d being seen today for ongoing prenatal care.  She is currently monitored for the following issues for this high-risk pregnancy and has Gingivitis; Supervision of other normal pregnancy, antepartum; Smoker; Substance abuse affecting pregnancy, antepartum; Late prenatal care affecting pregnancy; History of cesarean section x 2; Unwanted fertility; Nausea and vomiting in pregnancy; and Gestational diabetes mellitus (GDM) affecting pregnancy, antepartum on their problem list.  Patient reports pain in inner thighs.  Contractions: Not present. Vag. Bleeding: None.  Movement: Present. Denies any leaking of fluid.   The following portions of the patient's history were reviewed and updated as appropriate: allergies, current medications, past family history, past medical history, past social history, past surgical history and problem list.   Objective:   Vitals:   01/31/19 0827  BP: 128/77  Pulse: 78    Fetal Status:     Movement: Present     General:  Alert, oriented and cooperative. Patient is in no acute distress.  Respiratory: Normal respiratory effort, no problems with respiration noted  Mental Status: Normal mood and affect. Normal behavior. Normal judgment and thought content.   Rest of physical exam deferred due to type of encounter  Imaging:   Assessment and Plan:  Pregnancy: G3P2002 at [redacted]w[redacted]d  1. Gestational diabetes mellitus (GDM) affecting pregnancy, antepartum FG: 80-95, states she is having trouble taking fasting sometimes because she forgets PP: up to 120, occasionally slightly higher Cont diet control Last growth wnl  2. Supervision of other normal pregnancy, antepartum  3. Substance abuse affecting pregnancy, antepartum  4. History of cesarean section x 2 RCS scheduled for 02/17/19  5. Unwanted fertility For BTL  Preterm labor symptoms and general obstetric precautions including but not limited to vaginal bleeding, contractions, leaking of fluid and fetal movement were reviewed in detail with the patient. I discussed the assessment and treatment plan with the patient. The patient was provided an opportunity to ask questions and all were answered. The patient agreed with the plan and demonstrated an understanding of the instructions. The patient was advised to call back or seek an in-person office evaluation/go to [facility] at [facility] for any urgent or concerning symptoms. Please refer to After Visit Summary for other counseling recommendations.   I provided 15 minutes of non face-to-face time during this encounter.  Return in about 1 week (around 02/07/2019) for OB visit (MD), virtual.  No future appointments.  Sloan Leiter, MD [facility] for [place] [place]

## 2019-02-06 ENCOUNTER — Encounter (HOSPITAL_COMMUNITY): Payer: Self-pay

## 2019-02-06 NOTE — Patient Instructions (Signed)
Marlean Mortell [place]  02/06/2019   Your procedure is scheduled on:  02/17/2019  Arrive at 18 at Entrance C on [address] at [facility]  and [employer]. You are invited to use the FREE valet parking or use the Visitor's parking deck.  Pick up the phone at the desk and dial [phone].  Call this number if you have problems the morning of surgery: [phone]  Remember:   Do not eat food:(After Midnight) Desps de medianoche.  Do not drink clear liquids: (After Midnight) Desps de medianoche.  Take these medicines the morning of surgery with A SIP OF WATER:  none   Do not wear jewelry, make-up or nail polish.  Do not wear lotions, powders, or perfumes. Do not wear deodorant.  Do not shave 48 hours prior to surgery.  Do not bring valuables to the hospital.  [facility] is not   responsible for any belongings or valuables brought to the hospital.  Contacts, dentures or bridgework may not be worn into surgery.  Leave suitcase in the car. After surgery it may be brought to your room.  For patients admitted to the hospital, checkout time is 11:00 AM the day of              discharge.      Please read over the following fact sheets that you were given:     Preparing for Surgery

## 2019-02-11 ENCOUNTER — Other Ambulatory Visit: Payer: Self-pay | Admitting: Obstetrics & Gynecology

## 2019-02-15 ENCOUNTER — Ambulatory Visit (HOSPITAL_COMMUNITY)
Admission: RE | Admit: 2019-02-15 | Discharge: 2019-02-15 | Disposition: A | Discharge status: Home / Self Care | Payer: Medicaid Other | Source: Ambulatory Visit | Attending: Obstetrics and Gynecology | Admitting: Obstetrics and Gynecology

## 2019-02-15 ENCOUNTER — Other Ambulatory Visit: Payer: Self-pay

## 2019-02-15 DIAGNOSIS — Z20828 Contact with and (suspected) exposure to other viral communicable diseases: Secondary | ICD-10-CM | POA: Diagnosis not present

## 2019-02-15 DIAGNOSIS — Z01812 Encounter for preprocedural laboratory examination: Secondary | ICD-10-CM | POA: Insufficient documentation

## 2019-02-15 LAB — TYPE AND SCREEN
ABO/RH(D): AB NEG
Antibody Screen: NEGATIVE

## 2019-02-15 LAB — CBC
HCT: 38.5 % (ref 36.0–46.0)
Hemoglobin: 13.2 g/dL (ref 12.0–15.0)
MCH: 28.7 pg (ref 26.0–34.0)
MCHC: 34.3 g/dL (ref 30.0–36.0)
MCV: 83.7 fL (ref 80.0–100.0)
Platelets: 156 10*3/uL (ref 150–400)
RBC: 4.6 MIL/uL (ref 3.87–5.11)
RDW: 13.9 % (ref 11.5–15.5)
WBC: 10.5 10*3/uL (ref 4.0–10.5)
nRBC: 0 % (ref 0.0–0.2)

## 2019-02-15 LAB — ABO/RH: ABO/RH(D): AB NEG

## 2019-02-15 LAB — SARS CORONAVIRUS 2 (TAT 6-24 HRS): SARS Coronavirus 2: NEGATIVE

## 2019-02-15 NOTE — MAU Note (Signed)
Covid swab collected.PT tolerated well.PT asymptomatic. Labs drawn

## 2019-02-16 LAB — RPR: RPR Ser Ql: NONREACTIVE

## 2019-02-17 ENCOUNTER — Inpatient Hospital Stay (HOSPITAL_COMMUNITY): Payer: Medicaid Other | Admitting: Certified Registered Nurse Anesthetist

## 2019-02-17 ENCOUNTER — Encounter (HOSPITAL_COMMUNITY)
Admission: RE | Disposition: A | Discharge status: Home / Self Care | Payer: Self-pay | Source: Home / Self Care | Attending: Obstetrics and Gynecology

## 2019-02-17 ENCOUNTER — Inpatient Hospital Stay (HOSPITAL_COMMUNITY)
Admission: RE | Admit: 2019-02-17 | Discharge: 2019-02-19 | DRG: 784 | Disposition: A | Discharge status: Home / Self Care | Payer: Medicaid Other | Attending: Obstetrics and Gynecology | Admitting: Obstetrics and Gynecology

## 2019-02-17 ENCOUNTER — Other Ambulatory Visit: Payer: Self-pay

## 2019-02-17 ENCOUNTER — Encounter (HOSPITAL_COMMUNITY): Payer: Self-pay

## 2019-02-17 DIAGNOSIS — F1210 Cannabis abuse, uncomplicated: Secondary | ICD-10-CM | POA: Diagnosis present

## 2019-02-17 DIAGNOSIS — Z302 Encounter for sterilization: Secondary | ICD-10-CM

## 2019-02-17 DIAGNOSIS — O99334 Smoking (tobacco) complicating childbirth: Secondary | ICD-10-CM | POA: Diagnosis present

## 2019-02-17 DIAGNOSIS — O24420 Gestational diabetes mellitus in childbirth, diet controlled: Secondary | ICD-10-CM | POA: Diagnosis present

## 2019-02-17 DIAGNOSIS — F17210 Nicotine dependence, cigarettes, uncomplicated: Secondary | ICD-10-CM | POA: Diagnosis present

## 2019-02-17 DIAGNOSIS — Z3A39 39 weeks gestation of pregnancy: Secondary | ICD-10-CM | POA: Diagnosis not present

## 2019-02-17 DIAGNOSIS — Z98891 History of uterine scar from previous surgery: Secondary | ICD-10-CM

## 2019-02-17 DIAGNOSIS — O99324 Drug use complicating childbirth: Secondary | ICD-10-CM | POA: Diagnosis present

## 2019-02-17 DIAGNOSIS — O34211 Maternal care for low transverse scar from previous cesarean delivery: Secondary | ICD-10-CM | POA: Diagnosis present

## 2019-02-17 DIAGNOSIS — O24410 Gestational diabetes mellitus in pregnancy, diet controlled: Secondary | ICD-10-CM | POA: Diagnosis not present

## 2019-02-17 DIAGNOSIS — Z3009 Encounter for other general counseling and advice on contraception: Secondary | ICD-10-CM | POA: Diagnosis present

## 2019-02-17 LAB — CBC
HCT: 34.9 % — ABNORMAL LOW (ref 36.0–46.0)
Hemoglobin: 11.7 g/dL — ABNORMAL LOW (ref 12.0–15.0)
MCH: 28.5 pg (ref 26.0–34.0)
MCHC: 33.5 g/dL (ref 30.0–36.0)
MCV: 84.9 fL (ref 80.0–100.0)
Platelets: 144 10*3/uL — ABNORMAL LOW (ref 150–400)
RBC: 4.11 MIL/uL (ref 3.87–5.11)
RDW: 13.8 % (ref 11.5–15.5)
WBC: 15.5 10*3/uL — ABNORMAL HIGH (ref 4.0–10.5)
nRBC: 0 % (ref 0.0–0.2)

## 2019-02-17 LAB — TYPE AND SCREEN
ABO/RH(D): AB NEG
Antibody Screen: NEGATIVE

## 2019-02-17 LAB — GLUCOSE, CAPILLARY
Glucose-Capillary: 84 mg/dL (ref 70–99)
Glucose-Capillary: 92 mg/dL (ref 70–99)

## 2019-02-17 LAB — CREATININE, SERUM
Creatinine, Ser: 0.46 mg/dL (ref 0.44–1.00)
GFR calc Af Amer: >60 mL/min (ref >60)
GFR calc non Af Amer: >60 mL/min (ref >60)

## 2019-02-17 SURGERY — Surgical Case
Anesthesia: Spinal | Site: Abdomen

## 2019-02-17 MED ORDER — METOCLOPRAMIDE HCL 5 MG/ML IJ SOLN: 10.0000 mg | Freq: Once | INTRAMUSCULAR | Status: DC | PRN

## 2019-02-17 MED ORDER — EPHEDRINE SULFATE 50 MG/ML IJ SOLN
INTRAMUSCULAR | Status: DC | PRN
  Administered 2019-02-17: 5 mg via INTRAVENOUS

## 2019-02-17 MED ORDER — LACTATED RINGERS IV SOLN
INTRAVENOUS | Status: DC
  Administered 2019-02-17: 20:00:00 via INTRAVENOUS

## 2019-02-17 MED ORDER — IBUPROFEN 600 MG PO TABS
600.0000 mg | ORAL_TABLET | Freq: Four times a day (QID) | ORAL | Status: DC | PRN
  Administered 2019-02-17 – 2019-02-19 (×5): 600 mg via ORAL
  Filled 2019-02-17 (×5): qty 1

## 2019-02-17 MED ORDER — CEFAZOLIN SODIUM-DEXTROSE 2-4 GM/100ML-% IV SOLN
2.0000 g | INTRAVENOUS | Status: AC
  Administered 2019-02-17: 09:00:00 2 g via INTRAVENOUS

## 2019-02-17 MED ORDER — DIPHENHYDRAMINE HCL 50 MG/ML IJ SOLN: 12.5000 mg | INTRAMUSCULAR | Status: DC | PRN

## 2019-02-17 MED ORDER — LACTATED RINGERS IV SOLN
INTRAVENOUS | Status: DC | PRN
  Administered 2019-02-17 (×2): via INTRAVENOUS

## 2019-02-17 MED ORDER — SODIUM CHLORIDE 0.9 % IV SOLN
INTRAVENOUS | Status: DC | PRN
  Administered 2019-02-17: 10:00:00 via INTRAVENOUS

## 2019-02-17 MED ORDER — STERILE WATER FOR IRRIGATION IR SOLN
Status: DC | PRN
  Administered 2019-02-17: 1000 mL

## 2019-02-17 MED ORDER — NICOTINE 14 MG/24HR TD PT24
14.0000 mg | MEDICATED_PATCH | Freq: Every day | TRANSDERMAL | Status: DC
  Administered 2019-02-17 – 2019-02-18 (×2): 14 mg via TRANSDERMAL
  Filled 2019-02-17 (×2): qty 1

## 2019-02-17 MED ORDER — BUPIVACAINE IN DEXTROSE 0.75-8.25 % IT SOLN
INTRATHECAL | Status: DC | PRN
  Administered 2019-02-17: 1.4 mL via INTRATHECAL

## 2019-02-17 MED ORDER — PHENYLEPHRINE HCL-NACL 20-0.9 MG/250ML-% IV SOLN
INTRAVENOUS | Status: AC
  Filled 2019-02-17: qty 250

## 2019-02-17 MED ORDER — ONDANSETRON HCL 4 MG/2ML IJ SOLN
INTRAMUSCULAR | Status: AC
  Filled 2019-02-17: qty 2

## 2019-02-17 MED ORDER — MENTHOL 3 MG MT LOZG: 1.0000 | LOZENGE | OROMUCOSAL | Status: DC | PRN

## 2019-02-17 MED ORDER — PRENATAL MULTIVITAMIN CH
1.0000 | ORAL_TABLET | Freq: Every day | ORAL | Status: DC
  Administered 2019-02-18 – 2019-02-19 (×2): 1 via ORAL
  Filled 2019-02-17 (×2): qty 1

## 2019-02-17 MED ORDER — SOD CITRATE-CITRIC ACID 500-334 MG/5ML PO SOLN
30.0000 mL | ORAL | Status: AC
  Administered 2019-02-17: 30 mL via ORAL

## 2019-02-17 MED ORDER — ACETAMINOPHEN 500 MG PO TABS
ORAL_TABLET | ORAL | Status: AC
  Filled 2019-02-17: qty 2

## 2019-02-17 MED ORDER — FENTANYL CITRATE (PF) 100 MCG/2ML IJ SOLN: 25.0000 ug | INTRAMUSCULAR | Status: DC | PRN

## 2019-02-17 MED ORDER — SENNOSIDES-DOCUSATE SODIUM 8.6-50 MG PO TABS
2.0000 | ORAL_TABLET | ORAL | Status: DC
  Administered 2019-02-17 – 2019-02-18 (×2): 2 via ORAL
  Filled 2019-02-17 (×2): qty 2

## 2019-02-17 MED ORDER — SODIUM CHLORIDE 0.9 % IV SOLN
INTRAVENOUS | Status: DC | PRN
  Administered 2019-02-17: 09:00:00 60 ug/min via INTRAVENOUS

## 2019-02-17 MED ORDER — SIMETHICONE 80 MG PO CHEW
80.0000 mg | CHEWABLE_TABLET | ORAL | Status: DC
  Administered 2019-02-17 – 2019-02-18 (×2): 80 mg via ORAL
  Filled 2019-02-17 (×2): qty 1

## 2019-02-17 MED ORDER — SOD CITRATE-CITRIC ACID 500-334 MG/5ML PO SOLN
ORAL | Status: AC
  Filled 2019-02-17: qty 30

## 2019-02-17 MED ORDER — SIMETHICONE 80 MG PO CHEW
80.0000 mg | CHEWABLE_TABLET | Freq: Three times a day (TID) | ORAL | Status: DC
  Administered 2019-02-18 – 2019-02-19 (×3): 80 mg via ORAL
  Filled 2019-02-17 (×4): qty 1

## 2019-02-17 MED ORDER — KETOROLAC TROMETHAMINE 30 MG/ML IJ SOLN: 30.0000 mg | Freq: Four times a day (QID) | INTRAMUSCULAR | Status: AC | PRN

## 2019-02-17 MED ORDER — ZOLPIDEM TARTRATE 5 MG PO TABS: 5.0000 mg | ORAL_TABLET | Freq: Every evening | ORAL | Status: DC | PRN

## 2019-02-17 MED ORDER — ENOXAPARIN SODIUM 40 MG/0.4ML ~~LOC~~ SOLN: 40.0000 mg | SUBCUTANEOUS | Status: DC

## 2019-02-17 MED ORDER — FENTANYL CITRATE (PF) 100 MCG/2ML IJ SOLN
INTRAMUSCULAR | Status: AC
  Filled 2019-02-17: qty 2

## 2019-02-17 MED ORDER — DIPHENHYDRAMINE HCL 25 MG PO CAPS: 25.0000 mg | ORAL_CAPSULE | Freq: Four times a day (QID) | ORAL | Status: DC | PRN

## 2019-02-17 MED ORDER — GABAPENTIN 300 MG PO CAPS
300.0000 mg | ORAL_CAPSULE | ORAL | Status: AC
  Administered 2019-02-17: 300 mg via ORAL

## 2019-02-17 MED ORDER — MORPHINE SULFATE (PF) 0.5 MG/ML IJ SOLN
INTRAMUSCULAR | Status: DC | PRN
  Administered 2019-02-17: .15 mg via INTRATHECAL

## 2019-02-17 MED ORDER — ONDANSETRON HCL 4 MG/2ML IJ SOLN
INTRAMUSCULAR | Status: DC | PRN
  Administered 2019-02-17: 4 mg via INTRAVENOUS

## 2019-02-17 MED ORDER — NALOXONE HCL 4 MG/10ML IJ SOLN
1.0000 ug/kg/h | INTRAVENOUS | Status: DC | PRN
  Filled 2019-02-17: qty 5

## 2019-02-17 MED ORDER — NALOXONE HCL 0.4 MG/ML IJ SOLN: 0.4000 mg | INTRAMUSCULAR | Status: DC | PRN

## 2019-02-17 MED ORDER — DIBUCAINE (PERIANAL) 1 % EX OINT: 1.0000 | TOPICAL_OINTMENT | CUTANEOUS | Status: DC | PRN

## 2019-02-17 MED ORDER — NALBUPHINE HCL 10 MG/ML IJ SOLN: 5.0000 mg | Freq: Once | INTRAMUSCULAR | Status: DC | PRN

## 2019-02-17 MED ORDER — OXYTOCIN 40 UNITS IN NORMAL SALINE INFUSION - SIMPLE MED: 2.5000 [IU]/h | INTRAVENOUS | Status: AC

## 2019-02-17 MED ORDER — MEPERIDINE HCL 25 MG/ML IJ SOLN: 6.2500 mg | INTRAMUSCULAR | Status: DC | PRN

## 2019-02-17 MED ORDER — SIMETHICONE 80 MG PO CHEW: 80.0000 mg | CHEWABLE_TABLET | ORAL | Status: DC | PRN

## 2019-02-17 MED ORDER — ACETAMINOPHEN 325 MG PO TABS
650.0000 mg | ORAL_TABLET | Freq: Four times a day (QID) | ORAL | Status: DC | PRN
  Administered 2019-02-18 – 2019-02-19 (×4): 650 mg via ORAL
  Filled 2019-02-17 (×4): qty 2

## 2019-02-17 MED ORDER — GABAPENTIN 300 MG PO CAPS
ORAL_CAPSULE | ORAL | Status: AC
  Filled 2019-02-17: qty 1

## 2019-02-17 MED ORDER — SODIUM CHLORIDE 0.9 % IR SOLN
Status: DC | PRN
  Administered 2019-02-17: 1

## 2019-02-17 MED ORDER — OXYTOCIN 40 UNITS IN NORMAL SALINE INFUSION - SIMPLE MED
INTRAVENOUS | Status: AC
  Filled 2019-02-17: qty 1000

## 2019-02-17 MED ORDER — ACETAMINOPHEN 500 MG PO TABS
1000.0000 mg | ORAL_TABLET | Freq: Four times a day (QID) | ORAL | Status: AC
  Administered 2019-02-17 – 2019-02-18 (×3): 1000 mg via ORAL
  Filled 2019-02-17 (×3): qty 2

## 2019-02-17 MED ORDER — SCOPOLAMINE 1 MG/3DAYS TD PT72: 1.0000 | MEDICATED_PATCH | Freq: Once | TRANSDERMAL | Status: DC

## 2019-02-17 MED ORDER — ACETAMINOPHEN 500 MG PO TABS
1000.0000 mg | ORAL_TABLET | ORAL | Status: AC
  Administered 2019-02-17: 09:00:00 1000 mg via ORAL

## 2019-02-17 MED ORDER — COCONUT OIL OIL
1.0000 "application " | TOPICAL_OIL | Status: DC | PRN
  Administered 2019-02-18: 1 via TOPICAL

## 2019-02-17 MED ORDER — DIPHENHYDRAMINE HCL 25 MG PO CAPS: 25.0000 mg | ORAL_CAPSULE | ORAL | Status: DC | PRN

## 2019-02-17 MED ORDER — ONDANSETRON HCL 4 MG/2ML IJ SOLN: 4.0000 mg | Freq: Three times a day (TID) | INTRAMUSCULAR | Status: DC | PRN

## 2019-02-17 MED ORDER — EPHEDRINE 5 MG/ML INJ
INTRAVENOUS | Status: AC
  Filled 2019-02-17: qty 10

## 2019-02-17 MED ORDER — CEFAZOLIN SODIUM-DEXTROSE 2-4 GM/100ML-% IV SOLN
INTRAVENOUS | Status: AC
  Filled 2019-02-17: qty 100

## 2019-02-17 MED ORDER — NALBUPHINE HCL 10 MG/ML IJ SOLN: 5.0000 mg | INTRAMUSCULAR | Status: DC | PRN

## 2019-02-17 MED ORDER — KETOROLAC TROMETHAMINE 30 MG/ML IJ SOLN
30.0000 mg | Freq: Four times a day (QID) | INTRAMUSCULAR | Status: AC | PRN
  Administered 2019-02-17 – 2019-02-18 (×2): 30 mg via INTRAVENOUS
  Filled 2019-02-17 (×2): qty 1

## 2019-02-17 MED ORDER — ENOXAPARIN SODIUM 60 MG/0.6ML ~~LOC~~ SOLN
50.0000 mg | SUBCUTANEOUS | Status: DC
  Administered 2019-02-18 – 2019-02-19 (×2): 50 mg via SUBCUTANEOUS
  Filled 2019-02-17 (×2): qty 0.6

## 2019-02-17 MED ORDER — BUPIVACAINE HCL (PF) 0.5 % IJ SOLN
INTRAMUSCULAR | Status: AC
  Filled 2019-02-17: qty 30

## 2019-02-17 MED ORDER — BUPIVACAINE HCL (PF) 0.5 % IJ SOLN
INTRAMUSCULAR | Status: DC | PRN
  Administered 2019-02-17: 30 mL

## 2019-02-17 MED ORDER — MORPHINE SULFATE (PF) 0.5 MG/ML IJ SOLN
INTRAMUSCULAR | Status: AC
  Filled 2019-02-17: qty 10

## 2019-02-17 MED ORDER — WITCH HAZEL-GLYCERIN EX PADS: 1.0000 "application " | MEDICATED_PAD | CUTANEOUS | Status: DC | PRN

## 2019-02-17 MED ORDER — OXYCODONE HCL 5 MG PO TABS
5.0000 mg | ORAL_TABLET | ORAL | Status: DC | PRN
  Administered 2019-02-17 – 2019-02-18 (×2): 10 mg via ORAL
  Administered 2019-02-18: 16:00:00 5 mg via ORAL
  Administered 2019-02-18: 10 mg via ORAL
  Administered 2019-02-18 – 2019-02-19 (×2): 5 mg via ORAL
  Administered 2019-02-19: 10 mg via ORAL
  Filled 2019-02-17 (×3): qty 2
  Filled 2019-02-17 (×3): qty 1
  Filled 2019-02-17: qty 2

## 2019-02-17 MED ORDER — FENTANYL CITRATE (PF) 100 MCG/2ML IJ SOLN
INTRAMUSCULAR | Status: DC | PRN
  Administered 2019-02-17: 15 ug via INTRATHECAL

## 2019-02-17 MED ORDER — SODIUM CHLORIDE 0.9% FLUSH: 3.0000 mL | INTRAVENOUS | Status: DC | PRN

## 2019-02-17 MED ORDER — LACTATED RINGERS IV SOLN
INTRAVENOUS | Status: DC
  Administered 2019-02-17: 13:00:00 via INTRAVENOUS

## 2019-02-17 MED ORDER — FENTANYL CITRATE (PF) 100 MCG/2ML IJ SOLN
50.0000 ug | INTRAMUSCULAR | Status: DC | PRN
  Administered 2019-02-17: 50 ug via INTRAVENOUS
  Filled 2019-02-17: qty 2

## 2019-02-17 MED ORDER — LACTATED RINGERS IV SOLN
INTRAVENOUS | Status: DC
  Administered 2019-02-17: 08:00:00 via INTRAVENOUS

## 2019-02-17 MED ORDER — SODIUM CHLORIDE 0.9 % IV SOLN
INTRAVENOUS | Status: DC | PRN
  Administered 2019-02-17: 10:00:00 40 [IU] via INTRAVENOUS

## 2019-02-17 MED ORDER — TETANUS-DIPHTH-ACELL PERTUSSIS 5-2.5-18.5 LF-MCG/0.5 IM SUSP: 0.5000 mL | Freq: Once | INTRAMUSCULAR | Status: DC

## 2019-02-17 SURGICAL SUPPLY — 40 items
BARRIER ADHS 3X4 INTERCEED (GAUZE/BANDAGES/DRESSINGS) IMPLANT
BRR ADH 4X3 ABS CNTRL BYND (GAUZE/BANDAGES/DRESSINGS)
CHLORAPREP W/TINT 26ML (MISCELLANEOUS) ×3 IMPLANT
CLAMP CORD UMBIL (MISCELLANEOUS) IMPLANT
CLIP FILSHIE TUBAL LIGA STRL (Clip) ×2 IMPLANT
CLOTH BEACON ORANGE TIMEOUT ST (SAFETY) ×3 IMPLANT
DRSG OPSITE POSTOP 4X10 (GAUZE/BANDAGES/DRESSINGS) ×3 IMPLANT
DRSG PAD ABDOMINAL 8X10 ST (GAUZE/BANDAGES/DRESSINGS) ×2 IMPLANT
ELECT REM PT RETURN 9FT ADLT (ELECTROSURGICAL) ×3
ELECTRODE REM PT RTRN 9FT ADLT (ELECTROSURGICAL) ×1 IMPLANT
EXTRACTOR VACUUM KIWI (MISCELLANEOUS) IMPLANT
GAUZE SPONGE 4X4 12PLY STRL LF (GAUZE/BANDAGES/DRESSINGS) ×4 IMPLANT
GLOVE BIO SURGEON STRL SZ 6.5 (GLOVE) ×2 IMPLANT
GLOVE BIO SURGEONS STRL SZ 6.5 (GLOVE) ×1
GLOVE BIOGEL PI IND STRL 7.0 (GLOVE) ×2 IMPLANT
GLOVE BIOGEL PI INDICATOR 7.0 (GLOVE) ×4
GOWN STRL REUS W/TWL LRG LVL3 (GOWN DISPOSABLE) ×6 IMPLANT
KIT ABG SYR 3ML LUER SLIP (SYRINGE) IMPLANT
NDL HYPO 25X5/8 SAFETYGLIDE (NEEDLE) IMPLANT
NEEDLE HYPO 22GX1.5 SAFETY (NEEDLE) IMPLANT
NEEDLE HYPO 25X5/8 SAFETYGLIDE (NEEDLE) IMPLANT
NS IRRIG 1000ML POUR BTL (IV SOLUTION) ×3 IMPLANT
PACK C SECTION WH (CUSTOM PROCEDURE TRAY) ×3 IMPLANT
PAD ABD DERMACEA PRESS 5X9 (GAUZE/BANDAGES/DRESSINGS) ×2 IMPLANT
PAD OB MATERNITY 4.3X12.25 (PERSONAL CARE ITEMS) ×3 IMPLANT
PENCIL SMOKE EVAC W/HOLSTER (ELECTROSURGICAL) ×3 IMPLANT
RETRACTOR TRAXI PANNICULUS (MISCELLANEOUS) IMPLANT
RETRACTOR WND ALEXIS 25 LRG (MISCELLANEOUS) IMPLANT
RTRCTR WOUND ALEXIS 25CM LRG (MISCELLANEOUS)
SUT PLAIN 2 0 (SUTURE) ×3
SUT PLAIN ABS 2-0 CT1 27XMFL (SUTURE) IMPLANT
SUT VIC AB 0 CT1 36 (SUTURE) ×18 IMPLANT
SUT VIC AB 2-0 CT1 27 (SUTURE) ×3
SUT VIC AB 2-0 CT1 TAPERPNT 27 (SUTURE) ×1 IMPLANT
SUT VIC AB 4-0 PS2 27 (SUTURE) ×3 IMPLANT
SYR CONTROL 10ML LL (SYRINGE) IMPLANT
TOWEL OR 17X24 6PK STRL BLUE (TOWEL DISPOSABLE) ×3 IMPLANT
TRAXI PANNICULUS RETRACTOR (MISCELLANEOUS) ×2
TRAY FOLEY W/BAG SLVR 14FR LF (SET/KITS/TRAYS/PACK) IMPLANT
WATER STERILE IRR 1000ML POUR (IV SOLUTION) ×3 IMPLANT

## 2019-02-17 NOTE — H&P (Signed)
Veronica Richardson is a 33 y.o. female presenting for  Repeat cesarean section and bilateral tubal ligation. [id number] at 5693w0d by 22 wk US. 2 previous cesarean sections and desires steriliization.   Patient reports feeling nervous but denies any other complaints today. Denies LOF, vaginal bleeding, contractions or decreased fetal movement. Denies SOB, cough, fever, chest pain, urinary symptoms or any other complaint.   OB History    Gravida  3   Para  2   Term  2   Preterm  0   AB  0   Living  2     SAB  0   TAB  0   Ectopic  0   Multiple  0   Live Births  2          Past Medical History:  Diagnosis Date  . Anxiety   . Arthritis    Right ankle after a fracture  . Depression   . H/O varicella    as child   Past Surgical History:  Procedure Laterality Date  . ANKLE ARTHROSCOPY     Right  . ANKLE SURGERY Right   . CESAREAN SECTION  04/10/2012   Procedure: CESAREAN SECTION;  Surgeon: Freddrick MarchKendra H. Tenny Crawoss, MD;  Location: [facility];  Service: Obstetrics;  Laterality: N/A;   Family History: family history includes Bipolar disorder in her maternal aunt; COPD in her maternal aunt and mother; Diabetes in her maternal aunt and paternal uncle; Heart disease in her maternal grandfather and maternal grandmother; Hypertension in her father, maternal aunt, maternal uncle, and paternal uncle. Social History:  reports that she has been smoking cigarettes. She has been smoking about 0.50 packs per day. She has never used smokeless tobacco. She reports current drug use. Drug: Marijuana. She reports that she does not drink alcohol.     Maternal Diabetes: Yes:  Diabetes Type:  Diet controlled Genetic Screening: Ordered and not done Maternal Ultrasounds/Referrals: Normal Fetal Ultrasounds or other Referrals:  None Maternal Substance Abuse:  Yes:  Type: Marijuana Significant Maternal Medications:  None Significant Maternal Lab Results:  None Other Comments:  None  ROS: All other systems  negative unless noted above in HPI.     Blood pressure 119/75, pulse 75, temperature 97.7 F (36.5 C), temperature source Oral, resp. rate 19, height 5\' 6"  (1.676 m), weight 92.5 kg, not currently breastfeeding. Exam Physical Exam  General: No acute distress, AAOx3 HEENT: EOMI, nares patent, moist mucous membranes Heart: Regular rate Lungs: Normal effort Abd: Gravid uterus Neuro: Grossly intact.  Skin: No rashes/wounds on exposed areas Psych: Normal mood and affect  Prenatal labs: ABO, Rh: --/--/AB NEG (08/30 [id number]) Antibody: PENDING (08/30 0817) Rubella: 1.61 (06/15 1205) RPR: NON REACTIVE (08/28 0918)  HBsAg: Negative (06/15 1205)  HIV: Non Reactive (06/15 1205)  GBS:   Negative  Assessment/Plan: The risks of cesarean section were discussed with the patient including but were not limited to: bleeding which may require transfusion or reoperation; infection which may require antibiotics; injury to bowel, bladder, ureters or other surrounding organs; injury to the fetus; need for additional procedures including hysterectomy in the event of a life-threatening hemorrhage; placental abnormalities wth subsequent pregnancies, incisional problems, thromboembolic phenomenon and other postoperative/anesthesia complications.  Patient also desires permanent sterilization.  Other reversible forms of contraception were discussed with patient; she declines all other modalities. Risks of procedure discussed with patient including but not limited to: risk of regret, permanence of method, bleeding, infection, injury to surrounding organs and need for additional  procedures.  Failure risk of about 1% with increased risk of ectopic gestation if pregnancy occurs was also discussed with patient.  Also discussed possibility of post-tubal pain syndrome. The patient concurred with the proposed plan, giving informed written consent for the procedures.  Patient has been NPO since 2330 on 8/29 and she will remain NPO  for procedure. Anesthesia and OR aware.  Preoperative prophylactic antibiotics and SCDs ordered on call to the OR.  To OR when ready.  [place] 02/17/2019, 9:00 AM

## 2019-02-17 NOTE — Anesthesia Preprocedure Evaluation (Signed)
Anesthesia Evaluation  Patient identified by MRN, date of birth, ID band Patient awake    Reviewed: Allergy & Precautions, NPO status , Patient's Chart, lab work & pertinent test results  Airway Mallampati: II  TM Distance: >3 FB Neck ROM: Full    Dental no notable dental hx.    Pulmonary neg pulmonary ROS, Current Smoker and Patient abstained from smoking.,    Pulmonary exam normal breath sounds clear to auscultation       Cardiovascular negative cardio ROS Normal cardiovascular exam Rhythm:Regular Rate:Normal     Neuro/Psych negative neurological ROS  negative psych ROS   GI/Hepatic negative GI ROS, Neg liver ROS,   Endo/Other  diabetes, Gestational  Renal/GU negative Renal ROS  negative genitourinary   Musculoskeletal negative musculoskeletal ROS (+)   Abdominal   Peds negative pediatric ROS (+)  Hematology negative hematology ROS (+)   Anesthesia Other Findings   Reproductive/Obstetrics (+) Pregnancy                             Anesthesia Physical Anesthesia Plan  ASA: II  Anesthesia Plan: Spinal   Post-op Pain Management:    Induction:   PONV Risk Score and Plan: 2 and Ondansetron and Treatment may vary due to age or medical condition  Airway Management Planned: Natural Airway  Additional Equipment:   Intra-op Plan:   Post-operative Plan:   Informed Consent: I have reviewed the patients History and Physical, chart, labs and discussed the procedure including the risks, benefits and alternatives for the proposed anesthesia with the patient or authorized representative who has indicated his/her understanding and acceptance.     Dental advisory given  Plan Discussed with: CRNA  Anesthesia Plan Comments:         Anesthesia Quick Evaluation

## 2019-02-17 NOTE — Discharge Summary (Addendum)
Postpartum Discharge Summary    Patient Name: Veronica Richardson DOB: March 05, 1986 MRN: [id number]  Date of admission: 02/17/2019 Delivering Provider: [place] Mode   Date of discharge: 02/19/2019  Admitting diagnosis: REPEAT CESARENA SECTION Intrauterine pregnancy: [redacted]w[redacted]d    Secondary diagnosis:  Active Problems:   History of cesarean section x 2   Unwanted fertility   Status post cesarean delivery  Additional problems: None     Discharge diagnosis: Term Pregnancy Delivered and GDM A1                                                                                                Post partum procedures: None  Augmentation: Scheduled Cesarean  Complications: None  Hospital course:  Sceduled C/S   33y.o. yo G3P2002 at 384w0das admitted to the hospital 02/17/2019 for scheduled cesarean section with the following indication:Elective Repeat. Patient with history of 2 prior cesarean sections. Membrane Rupture Time/Date: 10:01 AM ,02/17/2019   Patient delivered a Viable infant.02/17/2019  Details of operation can be found in separate operative note.  Pateint had an uncomplicated postpartum course.  She is ambulating, tolerating a regular diet, passing flatus, and urinating well. Patient is discharged home in stable condition on  02/19/19. She is to follow-up for 2 hour GTT and incision check.         Delivery time: 10:01 AM    Magnesium Sulfate received: No BMZ received: No Rhophylac: NA due to baby's blood type MMR:N/A Transfusion:No  Physical exam  Vitals:   02/18/19 0400 02/18/19 0740 02/18/19 2207 02/19/19 0536  BP: (!) 105/58 110/61 124/70 110/65  Pulse: 64 66 70 (!) 56  Resp: _0 Temp: 97.9 F (36.6 C) 98.3 F (36.8 C) 98.2 F (36.8 C) 98 F (36.7 C)  TempSrc: Oral Oral Oral Oral  SpO2: 99% 98%    Weight:      Height:       General: alert, cooperative and no distress Lochia: appropriate Uterine Fundus: firm Incision: Healing well with no significant  drainage, No significant erythema, Dressing is clean, dry, and intact DVT Evaluation: No evidence of DVT seen on physical exam. Labs: Lab Results  Component Value Date   WBC 10.2 02/18/2019   HGB 11.0 (L) 02/18/2019   HCT 32.3 (L) 02/18/2019   MCV 85.4 02/18/2019   PLT 125 (L) 02/18/2019   CMP Latest Ref Rng & Units 02/17/2019  Glucose 70 - 99 mg/dL -  BUN 6 - 23 mg/dL -  Creatinine 0.44 - 1.00 mg/dL 0.46  Sodium 135 - 145 mEq/L -  Potassium 3.5 - 5.1 mEq/L -  Chloride 96 - 112 mEq/L -    Discharge instruction: per After Visit Summary and "Baby and Me Booklet".  After visit meds:  Allergies as of 02/19/2019   No Known Allergies     Medication List    STOP taking these medications   Accu-Chek FastClix Lancets Misc   Accu-Chek Guide test strip Generic drug: glucose blood   AMBULATORY NON FORMULARY MEDICATION   ondansetron 4 MG disintegrating tablet Commonly known as:  Zofran ODT     TAKE these medications   ibuprofen 600 MG tablet Commonly known as: ADVIL Take 1 tablet (600 mg total) by mouth every 6 (six) hours as needed for fever or headache.   oxyCODONE-acetaminophen 5-325 MG tablet Commonly known as: Percocet Take 1 tablet by mouth every 6 (six) hours as needed for up to 5 days for severe pain.   PRENATAL VITAMINS PO Take 1 tablet by mouth daily.       Diet: carb modified diet  Activity: Advance as tolerated. Pelvic rest for 6 weeks.   Outpatient follow up:4 weeks Follow up Appt: Future Appointments  Date Time Provider [place]  03/04/2019  1:15 PM Wilson Cook  03/06/2019 10:20 AM [place]  03/18/2019  3:35 PM Burleson, Rona Ravens, NP [place]  04/01/2019  8:20 AM [facility]   Follow up Visit:   Please schedule this patient for Postpartum visit in: 4 weeks with the following provider: Any provider For C/S patients schedule nurse incision check in weeks 2 weeks: yes Low  risk pregnancy complicated by: GDMA1 and 2 prior C/S Delivery mode:  CS Anticipated Birth Control:  BTL done PP PP Procedures needed: Incision check and 2 hour GTT  Schedule Integrated BH visit: yes   Newborn Data: Live born female  Birth Weight:   APGAR: 7, 9  Newborn Delivery   Birth date/time: 02/17/2019 10:01:00 Delivery type:       Baby Feeding: Breast Disposition:home with mother   02/19/2019 Chauncey Mann, MD

## 2019-02-17 NOTE — Lactation Note (Signed)
This note was copied from a baby's chart. Lactation Consultation Note  Patient Name: Veronica Richardson [id number] Date: 02/17/2019 Reason for consult: Initial assessment;Difficult latch   2156 - 2220 - I visited Ms. Hofacker to assist with breast feeding. Ms. Sferrazza is a P3 with possible insufficient glandular tissue and a history of low milk supply. She is also a current smoker, though she states that she is decreasing her intake.  I assisted with latching baby in cradle hold on mom's right breast. We first hand expressed and noted colostrum. Baby Khloe latched well with rhythmic suckling sequences. I showed mom how to gently pester her to stay active. Baby fed throughout our visit.  I educated on breast feeding basics including benefits of breast feeding, supply and demand, output expectations in the first days, and how to know if baby is getting enough to eat.  Mom states that she has anxiety and worries that baby is not eating enough. She has given a little bit of formula. I encouraged her to breast feed baby on demand 8-12 times a day and I asked her RN to set up a manual pump for additional stimulation. I recommended rest, when possible, tonight as well.  If mom provides formula, I encouraged her to breast feed first and to pump. Ms. Cadle verbalized understanding.   Maternal Data Formula Feeding for Exclusion: No Has patient been taught Hand Expression?: Yes Does the patient have breastfeeding experience prior to this delivery?: Yes  Feeding Feeding Type: Breast Fed  LATCH Score Latch: Grasps breast easily, tongue down, lips flanged, rhythmical sucking.  Audible Swallowing: A few with stimulation  Type of Nipple: Everted at rest and after stimulation  Comfort (Breast/Nipple): Soft / non-tender  Hold (Positioning): Assistance needed to correctly position infant at breast and maintain latch.  LATCH Score: 8  Interventions Interventions: Breast feeding basics reviewed;Assisted with  latch;Skin to skin;Hand express;Pre-pump if needed;Breast compression;Adjust position;Support pillows;Position options;Coconut oil;Hand pump  Lactation Tools Discussed/Used WIC Program: Yes Pump Review: Setup, frequency, and cleaning   Consult Status Consult Status: Follow-up Date: 02/18/19 Follow-up type: In-patient    Lenore Manner 02/17/2019, 10:29 PM

## 2019-02-17 NOTE — Transfer of Care (Signed)
Immediate Anesthesia Transfer of Care Note  Patient: Veronica Richardson  Procedure(s) Performed: REPEAT CESAREAN SECTION (N/A Abdomen)  Patient Location: PACU  Anesthesia Type:Spinal  Level of Consciousness: awake, alert  and oriented  Airway & Oxygen Therapy: Patient Spontanous Breathing  Post-op Assessment: Report given to RN and Post -op Vital signs reviewed and stable  Post vital signs: Reviewed and stable  Last Vitals:  Vitals Value Taken Time  BP 116/62 02/17/19 1103  Temp 36.6 C 02/17/19 1103  Pulse 59 02/17/19 1107  Resp 13 02/17/19 1108  SpO2 99 % 02/17/19 1107  Vitals shown include unvalidated device data.  Last Pain:  Vitals:   02/17/19 1103  TempSrc: Oral  PainSc:       Patients Stated Pain Goal: 5 (16/10/96 0454)  Complications: No apparent anesthesia complications

## 2019-02-17 NOTE — Anesthesia Procedure Notes (Signed)
Spinal  Patient location during procedure: OR Staffing Anesthesiologist: Mathea Frieling, MD Performed: anesthesiologist  Preanesthetic Checklist Completed: patient identified, site marked, surgical consent, pre-op evaluation, timeout performed, IV checked, risks and benefits discussed and monitors and equipment checked Spinal Block Patient position: sitting Prep: DuraPrep Patient monitoring: heart rate, continuous pulse ox and blood pressure Approach: right paramedian Location: L3-4 Injection technique: single-shot Needle Needle type: Sprotte  Needle gauge: 24 G Needle length: 9 cm Additional Notes Expiration date of kit checked and confirmed. Patient tolerated procedure well, without complications.       

## 2019-02-17 NOTE — Anesthesia Postprocedure Evaluation (Signed)
Anesthesia Post Note  Patient: Veronica Richardson  Procedure(s) Performed: REPEAT CESAREAN SECTION (N/A Abdomen)     Patient location during evaluation: PACU Anesthesia Type: Spinal Level of consciousness: awake and alert Pain management: pain level controlled Vital Signs Assessment: post-procedure vital signs reviewed and stable Respiratory status: spontaneous breathing and respiratory function stable Cardiovascular status: blood pressure returned to baseline and stable Postop Assessment: no headache, no backache, spinal receding and no apparent nausea or vomiting Anesthetic complications: no    Last Vitals:  Vitals:   02/17/19 1235 02/17/19 1350  BP: 98/62 (!) 95/56  Pulse: 61 65  Resp: 18 18  Temp: 36.8 C 36.8 C  SpO2: 98% 98%    Last Pain:  Vitals:   02/17/19 1350  TempSrc: Oral  PainSc: 5    Pain Goal: Patients Stated Pain Goal: 5 (02/17/19 0745)              Epidural/Spinal Function Cutaneous sensation: Normal sensation (02/17/19 1350), Patient able to flex knees: Yes (02/17/19 1350), Patient able to lift hips off bed: Yes (02/17/19 1350), Back pain beyond tenderness at insertion site: No (02/17/19 1350), Progressively worsening motor and/or sensory loss: No (02/17/19 1350), Bowel and/or bladder incontinence post epidural: No (02/17/19 1350)  Montez Hageman

## 2019-02-17 NOTE — Op Note (Signed)
[name] PROCEDURE DATE: 02/17/2019  PREOPERATIVE DIAGNOSES: Intrauterine pregnancy at 7367w0d weeks gestation; 2 prior ceserean sections; undesired fertility  POSTOPERATIVE DIAGNOSES: The same  PROCEDURE: Repeat Low Transverse Cesarean Section, Bilateral Tubal Sterilization using Filshie clips  SURGEON:  Dr. Scheryl DarterJames Arnold  ASSISTANT:  Jerilynn Birkenheadhelsea Naryah Clenney, MD An experienced assistant was required given the standard of surgical care given the complexity of the case.  This assistant was needed for exposure, dissection, suctioning, retraction, instrument exchange, assisting with delivery with administration of fundal pressure, and for overall help during the procedure.  ANESTHESIOLOGIST: Dr. Acey Lavarignan  INDICATIONS: Veronica Richardson is a 33 y.o. G3P2002 at 3767w0d here for cesarean section and bilateral tubal sterilization secondary to the indications listed under preoperative diagnoses; please see preoperative note for further details.  The risks of surgery were discussed with the patient including but were not limited to: bleeding which may require transfusion or reoperation; infection which may require antibiotics; injury to bowel, bladder, ureters or other surrounding organs; injury to the fetus; need for additional procedures including hysterectomy in the event of a life-threatening hemorrhage; placental abnormalities wth subsequent pregnancies, incisional problems, thromboembolic phenomenon and other postoperative/anesthesia complications.  Patient also desires permanent sterilization.  Other reversible forms of contraception were discussed with patient; she declines all other modalities. Risks of procedure discussed with patient including but not limited to: risk of regret, permanence of method, bleeding, infection, injury to surrounding organs and need for additional procedures.  Failure risk of 1-2% with increased risk of ectopic gestation if pregnancy occurs was also discussed with patient.  Also discussed  possibility of post-tubal pain syndrome. The patient concurred with the proposed plan, giving informed written consent for the procedures.    FINDINGS:  Viable female infant in cephalic presentation. Clear amniotic fluid.  Intact placenta, three vessel cord.  Normal uterus, fallopian tubes and ovaries bilaterally. Fallopian tubes were sterilized bilaterally. APGAR (1 MIN): 7   APGAR (5 MINS): 9   APGAR (10 MINS):    ANESTHESIA: Spinal INTRAVENOUS FLUIDS: 2800 ml ESTIMATED BLOOD LOSS: 256 ml URINE OUTPUT:  100 ml SPECIMENS: Placenta sent to L&D  COMPLICATIONS: None immediate  PROCEDURE IN DETAIL:  The patient preoperatively received intravenous antibiotics and had sequential compression devices applied to her lower extremities.   She was then taken to the operating room where spinal anesthesia was administered and was found to be adequate. She was then placed in a dorsal supine position with a leftward tilt, and prepped and draped in a sterile manner.  A foley catheter was placed into her bladder and attached to constant gravity.  After an adequate timeout was performed, a Pfannenstiel skin incision was made with scalpel over her preexisting scar and carried through to the underlying layer of fascia. The fascia was incised in the midline, and this incision was extended bilaterally using the Mayo scissors.  Kocher clamps were applied to the superior aspect of the fascial incision and the underlying rectus muscles were dissected off bluntly.  A similar process was carried out on the inferior aspect of the fascial incision. The rectus muscles were separated in the midline and the peritoneum was entered sharply and then bluntly. Bladder flab created. The Alexis self-retaining retractor was introduced into the abdominal cavity.  Attention was turned to the lower uterine segment where a low transverse hysterotomy was made with a scalpel and extended bilaterally bluntly.  The infant was successfully delivered,  the cord was clamped and cut after one minute, and the infant was  handed over to the awaiting neonatology team. Uterine massage was then administered, and the placenta delivered intact with a three-vessel cord. The uterus was then cleared of clots and debris.  The hysterotomy was closed with 0 Vicryl in a running locked fashion, and an imbricating layer was also placed with 0 Vicryl. Attention was then turned to the fallopian tubes, and Filshie clips were placed about 3 cm from the cornua of both fallopian tubes, with care given to incorporate the underlying mesosalpinx on both sides, allowing for bilateral tubal sterilization. The pelvis was cleared of all clot and debris. Hemostasis was confirmed on all surfaces.  The retractor was removed.  The peritoneum was closed with a 2-0 Vicryl running stitch. The fascia was then closed using 0 Vicryl in a running fashion.  The subcutaneous layer was irrigated, reapproximated with 2-0 plain gut interrupted stitches, 0.5% Marcaine injected into incision and then the skin was closed with a 4-0 Vicryl subcuticular stitch. The patient tolerated the procedure well. Sponge, instrument and needle counts were correct x 3.  She was taken to the recovery room in stable condition.   Barrington Ellison, MD [facility] Family [facility] Fellow, [facility] for [employer], [place]

## 2019-02-18 LAB — CBC
HCT: 32.3 % — ABNORMAL LOW (ref 36.0–46.0)
Hemoglobin: 11 g/dL — ABNORMAL LOW (ref 12.0–15.0)
MCH: 29.1 pg (ref 26.0–34.0)
MCHC: 34.1 g/dL (ref 30.0–36.0)
MCV: 85.4 fL (ref 80.0–100.0)
Platelets: 125 10*3/uL — ABNORMAL LOW (ref 150–400)
RBC: 3.78 MIL/uL — ABNORMAL LOW (ref 3.87–5.11)
RDW: 14.3 % (ref 11.5–15.5)
WBC: 10.2 10*3/uL (ref 4.0–10.5)
nRBC: 0 % (ref 0.0–0.2)

## 2019-02-18 NOTE — Progress Notes (Signed)
Subjective: Postpartum Day 1: Cesarean Delivery Patient reports feeling some pain but she has been passing flatus, walking around and tolerating PO. She reports she does not want to take gabapentin as she has used this previously and did not like it.   Objective: Vital signs in last 24 hours: Temp:  [97.4 F (36.3 C)-98.2 F (36.8 C)] 97.9 F (36.6 C) (08/31 0400) Pulse Rate:  [56-86] 64 (08/31 0400) Resp:  [11-22] 18 (08/31 0400) BP: (95-121)/(52-96) 105/58 (08/31 0400) SpO2:  [93 %-100 %] 99 % (08/31 0400) Weight:  [92.5 kg] 92.5 kg (08/30 0744)  Physical Exam:  General: alert, cooperative, no distress and holding baby at breast Lochia: appropriate Uterine Fundus: firm Incision: healing well, no significant drainage DVT Evaluation: No evidence of DVT seen on physical exam.  Recent Labs    02/15/19 0918 02/17/19 1519  HGB 13.2 11.7*  HCT 38.5 34.9*    Assessment/Plan: Status post Cesarean section. Doing well postoperatively.  Continue current care. Patient is s/p BTL She had planned to give formula but is attempting to breastfeed; lactation support appreciated  Plan for discharge home tomorrow or 9/2  Chauncey Mann 02/18/2019, 4:54 AM

## 2019-02-18 NOTE — Lactation Note (Signed)
This note was copied from a baby's chart. Lactation Consultation Note  Patient Name: Veronica Richardson [id number] Date: 02/18/2019 Reason for consult: Follow-up assessment;Term;1st time breastfeeding  P3 mother whose infant is now 68 hours old.  Mother did not breast feed her other two children but desires to breast feed this baby.  Mother's feeding preference is breast/bottle.  She has already given some formula supplementation.  Baby was asleep in mother's arms when I arrived.  Mother informed me that she could not breast feed her other children because they would not latch well and she did not have enough breast milk.  It sounds like she may not have received the support needed with early breast feeding.  Mother stated that she would really like to breast feed this baby and that the baby already latches much better than her other two children.  Encouraged to feed 8-12 times/24 hours or sooner if baby shows feeding cues.  Mother stated she has given formula because she is worried that baby is not "getting enough."  Discussed breast feeding basics and milk coming to volume.  Suggested she try exclusively breast feeding if that is what she desires.  Discussed supply and demand and latching.  Mother's breasts are soft and non tender and nipples are everted and intact.  I see no evidence of poor latching at this time.  Suggested she perform hand expression before/after feedings to help stimulate milk supply and feed back any EBM she receives to baby.  Colostrum container provided and milk storage times reviewed.  Finger feeding demonstrated.  Mother does not have a DEBP for home use.  Informed her of the DEBP availability with the [facility] program if she is going to exclusively breast feed.  Mother has a manual pump.  She will call as needed for latch assistance.  Support person present.   Maternal Data Formula Feeding for Exclusion: Yes Reason for exclusion: Mother's choice to formula and breast feed on  admission Has patient been taught Hand Expression?: Yes Does the patient have breastfeeding experience prior to this delivery?: No  Feeding Feeding Type: Bottle Fed - Formula Nipple Type: Slow - flow  LATCH Score Latch: Grasps breast easily, tongue down, lips flanged, rhythmical sucking.  Audible Swallowing: A few with stimulation  Type of Nipple: Everted at rest and after stimulation  Comfort (Breast/Nipple): Soft / non-tender  Hold (Positioning): No assistance needed to correctly position infant at breast.  LATCH Score: 9  Interventions    Lactation Tools Discussed/Used WIC Program: Yes   Consult Status Consult Status: Follow-up Date: 02/19/19 Follow-up type: In-patient    Little Ishikawa 02/18/2019, 6:13 PM

## 2019-02-18 NOTE — Clinical Social Work Maternal (Signed)
CLINICAL SOCIAL WORK MATERNAL/CHILD NOTE  Patient Details  Name: Veronica Richardson MRN: [id number] Date of Birth: 20-May-1986  Date:  02/18/2019  Clinical Social Worker Initiating Note:  Elijio Miles Date/Time: Initiated:  02/18/19/1102     Child's Name:  Veronica Richardson   Biological Parents:  Mother, Father(Ellise Mccollum and Darrien Laakso DOB: 03/09/1988)   Need for Interpreter:  None   Reason for Referral:  Behavioral Health Concerns, Current Substance Use/Substance Use During Pregnancy    Address:  [place]  [address]    Phone number:  [phone] (home)     Additional phone number:   Household Members/Support Persons (HM/SP):   Household Member/Support Person 1, Household Member/Support Person 2, Household Member/Support Person 3, Household Member/Support Person 4   HM/SP Name Relationship DOB or Age  HM/SP -1 Noelene Gang Daughter 10/24/2017  HM/SP -2 Keysha Damewood Daughter 04/15/2012  HM/SP -3   Mother    HM/SP -4   Father    HM/SP -5        HM/SP -6        HM/SP -7        HM/SP -8          Natural Supports (not living in the home):  Friends, Extended Family(MOB reported having support from her best friend, parents, FOB and FOB's family)   Medical illustrator Supports: None   Employment: Unemployed   Type of Work:     Education:  Programmer, systems   Homebound arranged:    Museum/gallery curator Resources:  [employer]   Other Resources:  Physicist, medical (Has WIC but in another county, intends to transfer)   Cultural/Religious Considerations Which May Impact Care:    Strengths:  Ability to meet basic needs , Home prepared for child , Pediatrician chosen   Psychotropic Medications:         Pediatrician:    [place]  Pediatrician List:   [facility] ([address])  [place]      Pediatrician Fax Number:    Risk Factors/Current  Problems:  Substance Use , Mental Health Concerns    Cognitive State:  Alert , Able to Concentrate , Linear Thinking    Mood/Affect:  Calm , Interested , Flat    CSW Assessment:  CSW received consult for history of anxiety and depression and THC use during pregnancy.  CSW met with MOB to offer support and complete assessment.    MOB resting in bed holding infant to breast, when CSW entered the room. MOB's support person was exiting the room as CSW was entering. Once support person left, CSW introduced self and explained reason for consult to which MOB expressed understanding. MOB presented with a flat affect but reported she was feeling tired and was in some pain. Otherwise, MOB was pleasant and engaged throughout assessment and was appropriate and attentive to infant. MOB reported she and her two daughters currently live with her parents. MOB stated she currently receives food stamps and receives [facility] in a different county but intends to switch it over and is aware of process. CSW inquired about MOB's mental health history and MOB shared that depression and anxiety run in her family. MOB reported she first noticed these symptoms after the birth of her oldest daughter. 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 MOB shared that she was prescribed a different medication for each pregnancy but that neither was effective. MOB denied any history of counseling and was not interested in resources as she does not like talking about her problems with strangers. CSW processed with MOB about how she would cope if she noticed symptoms coming up again. MOB reported she has a really good support system this time, enjoys listening to music, going for walks and cleaning. CSW provided review of the baby blues period vs. perinatal mood disorders. CSW recommended self-evaluation  during the postpartum time period using the New Mom Checklist from Postpartum Progress and encouraged MOB to contact a medical professional if symptoms are noted at any time. MOB did not appear to be displaying any acute mental health symptoms and denied any SI, HI or DV. MOB reported feeling well-supported by her best friend/support person, her parents, FOB and FOB's family.   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 MOB stated as delivery got closer her nerves got worse and reported being worried about the epidural. MOB reported last use was a week ago. CSW informed MOB of Hospital Drug Policy and explained UDS came back positive for [facility] and that CDS would continue to be monitored. CSW explained that a [facility] CPS report would have to made to which MOB appeared concerned. CSW explained what process of report could look like and that seemed to help ease a majority of MOB's concerns. MOB denied any further questions or concerns regarding policy and report.   MOB confirmed having all essential items for infant once discharged and reported infant would be sleeping in a bassinet once home. CSW provided review of Sudden Infant Death Syndrome (SIDS) precautions and safe sleeping habits.  CSW to make [facility] CPS report due to infant's positive UDS for THC.    CSW Plan/Description:  No Further Intervention Required/No Barriers to Discharge, Sudden Infant Death Syndrome (SIDS) Education, Perinatal Mood and Anxiety Disorder (PMADs) Education, [place], Child Protective Service Report , CSW Will Continue to Monitor Umbilical Cord Tissue Drug Screen Results and Make Report if Foye Spurling, [place] 02/18/2019, 12:02 PM

## 2019-02-19 MED ORDER — OXYCODONE-ACETAMINOPHEN 5-325 MG PO TABS: 1.0000 | ORAL_TABLET | Freq: Four times a day (QID) | ORAL | 0 refills | Status: AC | PRN

## 2019-02-19 MED ORDER — IBUPROFEN 600 MG PO TABS: 600.0000 mg | ORAL_TABLET | Freq: Four times a day (QID) | ORAL | 0 refills | Status: DC | PRN

## 2019-02-19 NOTE — Progress Notes (Signed)
Mom verbalizes understanding of D.C instructiions, Verbalizes understanding of F/U appointments

## 2019-02-19 NOTE — Progress Notes (Signed)
CSW received phone call from V. Jimmye Norman, assigned [place] worker, stating she intends to follow up with MOB today 9/1. Per Ms. Williams, if MOB and infant discharge today she will follow up with MOB in the community as she also has to meet her other children. Ms. Jimmye Norman requested that CSW update her when discharge has been decided. No barriers to infant discharging to MOB at this time. CSW will continue to follow.  Elijio Miles, [place]  Women's and [employer] [phone]

## 2019-02-19 NOTE — Lactation Note (Signed)
This note was copied from a baby's chart. Lactation Consultation Note  Patient Name: Veronica Richardson [id number] Date: 02/19/2019 Reason for consult: Follow-up assessment;MD order;Term;1st time breastfeeding  P3 mother whose infant is now 26 hours old.  Mother did not breast feed her other two children but desires to breast feed this baby.  Mother's feeding preference is breast/bottle.  MD request to observe a feeding.  Mother was nearing the end of her feeding when I arrived.  She stated that baby had fed on both breasts for a total of about 30 minutes.  Baby self released from the breast and I asked mother to try to latch again so I may observe her.  Mother latched easily with a few suggestions from me on positioning and hold.  Mother accepted my suggestions.  Baby fed for only another couple of minutes, released and fell asleep.  This should indicate that she was content with her feeding.  Reminded mother to be diligent about feeding and to observe for cues.  She will continue hand expression and has a manual pump if needed.  Mother does not have a DEBP for home but has been in contact with [facility] and has a plan set up to obtain a [place] after discharge.  She will be able to obtain her pump today or tomorrow.  She has our OP phone number for questions/concerns after discharge.  Mother is packed and ready for discharge.  Consulted with pediatrician about this feeding.   Maternal Data Formula Feeding for Exclusion: No Has patient been taught Hand Expression?: Yes Does the patient have breastfeeding experience prior to this delivery?: No  Feeding Feeding Type: Breast Fed  LATCH Score Latch: Grasps breast easily, tongue down, lips flanged, rhythmical sucking.  Audible Swallowing: Spontaneous and intermittent  Type of Nipple: Everted at rest and after stimulation  Comfort (Breast/Nipple): Soft / non-tender  Hold (Positioning): Assistance needed to correctly position infant at breast and  maintain latch.  LATCH Score: 9  Interventions Interventions: Breast feeding basics reviewed;Assisted with latch;Skin to skin  Lactation Tools Discussed/Used WIC Program: Yes   Consult Status Consult Status: Complete Date: 02/19/19 Follow-up type: Call as needed    Gorman Safi R Stonewall Doss 02/19/2019, 11:28 AM

## 2019-03-04 ENCOUNTER — Other Ambulatory Visit: Payer: Self-pay

## 2019-03-04 ENCOUNTER — Telehealth: Payer: Medicaid Other | Admitting: Clinical

## 2019-03-04 DIAGNOSIS — Z8659 Personal history of other mental and behavioral disorders: Secondary | ICD-10-CM

## 2019-03-04 NOTE — BH Specialist Note (Addendum)
Pt did not arrive to video visit, and did answer the phone. Pt is requesting to reschedule for in-person visit on Wednesday, 03/06/19, after her nurse visit, as she forgot in the midst of caring for 63 week old newborn and helping with 32yo virtual schooling. Pt would like to talk about starting back on antidepressant at her visit with [facility] on 03/06/19, as she has a history of depression after previous birth.   [place] via Telemedicine Video Visit  03/04/2019 Veronica Richardson [id number]  Garlan Fair

## 2019-03-06 ENCOUNTER — Ambulatory Visit: Payer: Medicaid Other

## 2019-03-06 NOTE — BH Specialist Note (Signed)
Integrated Behavioral Health via Telemedicine Video Visit  03/06/2019 Veronica Richardson [id number]  Number of [place] visits: 1 Session Start time: 8:15  Session End time: 8:51 Total time: 35 minutes  Referring Provider: Emeterio Reeve, MD  Type of Visit: Video Patient/Family location: Home [facility] Provider location: [facility] All persons participating in visit: Patient Veronica Richardson and Veronica Richardson and [facility] Veronica Richardson  Confirmed patient's address: Yes  Confirmed patient's phone number: Yes  Any changes to demographics: No   Confirmed patient's insurance: Yes  Any changes to patient's insurance: No   Discussed confidentiality: Yes   I connected with Veronica Richardson  by a video enabled telemedicine application and verified that I am speaking with the correct person using two identifiers.     I discussed the limitations of evaluation and management by telemedicine and the availability of in person appointments.  I discussed that the purpose of this visit is to provide behavioral health care while limiting exposure to the novel coronavirus.   Discussed there is a possibility of technology failure and discussed alternative modes of communication if that failure occurs.  I discussed that engaging in this video visit, they consent to the provision of behavioral healthcare and the services will be billed under their insurance.  Patient and/or legal guardian expressed understanding and consented to video visit: Yes   PRESENTING CONCERNS: Patient and/or family reports the following symptoms/concerns: Pt states her primary concern is wanting to start back on antidepressant before her symptoms get worse, as with previous postpartum depression and anxiety; pt says that working helps her cope best.  Duration of problem: Increase postpartum; Severity of problem: moderate  STRENGTHS (Protective Factors/Coping Skills): Supportive family and FOB  GOALS ADDRESSED: Patient  will: 1.  Reduce symptoms of: anxiety and depression  2.  Increase knowledge and/or ability of: healthy habits  3.  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   Patient may benefit from psychoeducation and brief therapeutic interventions regarding coping with symptoms of depression and anxiety .  PLAN: 1. Follow up with behavioral health clinician on : One week 2. Behavioral recommendations:  -Begin taking BH medication as prescribed -Continue sleeping when baby sleeps, as able -Consider registering and attending at least one new mom support group at either [email or web address] or [email or web address].net 3. Referral(s): [place] (In Clinic)  I discussed the assessment and treatment plan with the patient and/or parent/guardian. They were provided an opportunity to ask questions and all were answered. They agreed with the plan and demonstrated an understanding of the instructions.   They were advised to call back or seek an in-person evaluation if the symptoms worsen or if the condition fails to improve as anticipated.  Caroleen Hamman Kimmie Berggren  GAD 7 : Generalized Anxiety Score 03/07/2019  Nervous, Anxious, on Edge 3  Control/stop worrying 3  Worry too much - different things 3  Trouble relaxing 0  Restless 0  Easily annoyed or irritable 1  Afraid - awful might happen 1  Total GAD 7 Score 11

## 2019-03-07 ENCOUNTER — Telehealth (INDEPENDENT_AMBULATORY_CARE_PROVIDER_SITE_OTHER): Payer: Medicaid Other | Admitting: Clinical

## 2019-03-07 ENCOUNTER — Other Ambulatory Visit: Payer: Self-pay

## 2019-03-07 ENCOUNTER — Ambulatory Visit (INDEPENDENT_AMBULATORY_CARE_PROVIDER_SITE_OTHER): Payer: Medicaid Other | Admitting: Lactation Services

## 2019-03-07 DIAGNOSIS — F4323 Adjustment disorder with mixed anxiety and depressed mood: Secondary | ICD-10-CM

## 2019-03-07 DIAGNOSIS — Z5189 Encounter for other specified aftercare: Secondary | ICD-10-CM

## 2019-03-07 NOTE — Progress Notes (Signed)
Pt in the office for incision check. Pt is concerned right side may be open a little.   Pt denies drainage. Pt reports not much pain. She is experiencing some itching, discussed this is normal.   Incision well approximated with a small scab to right side, no drainage noted.   Pt with reddened area 3 inches x 1 inches just above incision that is not raised, or draining. Looks like where that was tape irritation. She is reporting that she is keeping dry and patting dry as needed. Pt does have skin that hangs over the incision. She was informed to continue to pat dry and allow water to run over it.   Pt with no other questions/concerns as needed. Pt to follow up for PP visit.

## 2019-03-13 ENCOUNTER — Telehealth: Payer: Self-pay | Admitting: Clinical

## 2019-03-13 NOTE — Telephone Encounter (Signed)
Spoke with patient about her appointment on 9/24 @ 10:15. Patient instructed that the appointment is a mychart visit. Patient instructed to download the mychart app if not already done so. Patient verbalized she has the app downloaded. Patient instructed that a nurse will be calling her around her appointment time.

## 2019-03-13 NOTE — BH Specialist Note (Signed)
Integrated Behavioral Health via Telemedicine Video Visit  03/14/2019 Veronica Richardson [id number]  Number of [place] visits: 3 Session Start time: 10:15  Session End time: 10:27 Total time: 15 minutes  Referring Provider: Emeterio Reeve, MD Type of Visit: Video Patient/Family location: Home [facility] Provider location: [facility] All persons participating in visit: Patient Veronica Richardson   Confirmed patient's address: Yes  Confirmed patient's phone number: Yes  Any changes to demographics: No   Confirmed patient's insurance: Yes  Any changes to patient's insurance: No   Discussed confidentiality: At previous visit  I connected with Veronica Richardson  by a video enabled telemedicine application and verified that I am speaking with the correct person using two identifiers.     I discussed the limitations of evaluation and management by telemedicine and the availability of in person appointments.  I discussed that the purpose of this visit is to provide behavioral health care while limiting exposure to the novel coronavirus.   Discussed there is a possibility of technology failure and discussed alternative modes of communication if that failure occurs.  I discussed that engaging in this video visit, they consent to the provision of behavioral healthcare and the services will be billed under their insurance.  Patient and/or legal guardian expressed understanding and consented to video visit: Yes   PRESENTING CONCERNS: Patient and/or family reports the following symptoms/concerns: Pt states her primary concern today is still wanting to prevent escalation of symptoms of depression and anxiety, and stress over her father's poor health. Pt is anxious to get back to work, no other concerns.  Duration of problem: Postpartum increase with ongoing symptoms; Severity of problem: moderate  STRENGTHS (Protective Factors/Coping Skills): Supportive family   GOALS ADDRESSED: Patient  will: 1.  Reduce symptoms of: anxiety, depression and stress  2.  Demonstrate ability to: Increase healthy adjustment to current life circumstances  INTERVENTIONS: Interventions utilized:  Medication Monitoring Standardized Assessments completed: Not Needed  ASSESSMENT: Patient currently experiencing Adjustment disorder with mixed anxiety and depressed mood  Patient may benefit from continued brief therapeutic interventions regarding coping with symptoms of anxiety and depression.Marland Kitchen  PLAN: 1. Follow up with behavioral health clinician on : Two business days for [facility] med management call 2. Behavioral recommendations:  3. -Begin taking BH medication as prescribed 4. -Continue allowing family to help with children, as offered; sleeping when baby sleeps 5. Referral(s): [facility] (In Clinic)  I discussed the assessment and treatment plan with the patient and/or parent/guardian. They were provided an opportunity to ask questions and all were answered. They agreed with the plan and demonstrated an understanding of the instructions.   They were advised to call back or seek an in-person evaluation if the symptoms worsen or if the condition fails to improve as anticipated.  Veronica Richardson

## 2019-03-14 ENCOUNTER — Ambulatory Visit: Payer: Medicaid Other | Admitting: Clinical

## 2019-03-14 ENCOUNTER — Other Ambulatory Visit: Payer: Self-pay

## 2019-03-14 DIAGNOSIS — F4323 Adjustment disorder with mixed anxiety and depressed mood: Secondary | ICD-10-CM

## 2019-03-17 ENCOUNTER — Other Ambulatory Visit: Payer: Self-pay | Admitting: Nurse Practitioner

## 2019-03-17 MED ORDER — SERTRALINE HCL 50 MG PO TABS: 50.0000 mg | ORAL_TABLET | Freq: Every day | ORAL | 0 refills | Status: AC

## 2019-03-17 NOTE — Progress Notes (Signed)
Received message from office [facility] provider that patient needs to start Zoloft.  Medication sent to her pharmacy.  Earlie Server, RN, MSN, NP-BC Nurse Practitioner, [facility] for [employer], [employer] 03/17/2019 8:32 PM

## 2019-03-18 ENCOUNTER — Encounter: Payer: Self-pay | Admitting: Nurse Practitioner

## 2019-03-18 ENCOUNTER — Other Ambulatory Visit: Payer: Self-pay

## 2019-03-18 ENCOUNTER — Encounter: Payer: Self-pay | Admitting: *Deleted

## 2019-03-18 ENCOUNTER — Telehealth: Payer: Self-pay | Admitting: Nurse Practitioner

## 2019-03-18 ENCOUNTER — Telehealth: Payer: Medicaid Other | Admitting: Nurse Practitioner

## 2019-03-18 DIAGNOSIS — Z5329 Procedure and treatment not carried out because of patient's decision for other reasons: Secondary | ICD-10-CM

## 2019-03-18 DIAGNOSIS — Z91199 Patient's noncompliance with other medical treatment and regimen due to unspecified reason: Secondary | ICD-10-CM

## 2019-03-18 NOTE — Telephone Encounter (Signed)
Attempted to reach patient about her missed appointment. Was not able to leave a message.

## 2019-03-18 NOTE — Progress Notes (Signed)
Called pt for scheduled MyChart video visit for post partum care. She did not answer and I heard outgoing message stating that the call could not be completed at this time. MyChart message sent to pt. Per Earlie Server, pt needs to be rescheduled and also needs post partum 2hr GTT. Certified letter will be mailed to pt.    Postpartum patient - no show - not seen  Earlie Server, RN, MSN, NP-BC Nurse Practitioner, [facility] for [employer], [employer] 03/18/2019 5:25 PM

## 2019-03-19 ENCOUNTER — Other Ambulatory Visit: Payer: Self-pay

## 2019-03-19 ENCOUNTER — Telehealth: Payer: Medicaid Other | Admitting: Student

## 2019-03-19 DIAGNOSIS — Z91199 Patient's noncompliance with other medical treatment and regimen due to unspecified reason: Secondary | ICD-10-CM

## 2019-03-19 DIAGNOSIS — Z5329 Procedure and treatment not carried out because of patient's decision for other reasons: Secondary | ICD-10-CM

## 2019-03-19 NOTE — Progress Notes (Signed)
Attempted to call patient call can not be completed as dialed.

## 2019-03-21 ENCOUNTER — Encounter: Payer: Self-pay | Admitting: Family Medicine

## 2019-03-21 ENCOUNTER — Ambulatory Visit: Payer: Medicaid Other | Admitting: Obstetrics and Gynecology

## 2019-04-01 ENCOUNTER — Other Ambulatory Visit: Payer: Medicaid Other

## 2019-04-01 ENCOUNTER — Other Ambulatory Visit: Payer: Self-pay

## 2019-04-03 ENCOUNTER — Other Ambulatory Visit: Payer: Self-pay | Admitting: Emergency Medicine

## 2019-04-03 DIAGNOSIS — O24419 Gestational diabetes mellitus in pregnancy, unspecified control: Secondary | ICD-10-CM

## 2019-04-04 ENCOUNTER — Other Ambulatory Visit: Payer: Medicaid Other

## 2019-04-04 ENCOUNTER — Other Ambulatory Visit: Payer: Self-pay

## 2019-04-04 DIAGNOSIS — O24419 Gestational diabetes mellitus in pregnancy, unspecified control: Secondary | ICD-10-CM

## 2019-04-05 LAB — GLUCOSE TOLERANCE, 2 HOURS
Glucose, 2 hour: 88 mg/dL (ref 65–139)
Glucose, GTT - Fasting: 89 mg/dL (ref 65–99)

## 2019-04-11 ENCOUNTER — Ambulatory Visit: Payer: Medicaid Other | Admitting: Obstetrics and Gynecology

## 2019-07-06 IMAGING — US US MFM OB DETAIL +14 WK
1 series · 13 of 28 positions shown · non-contrast
Comparison: none

[Series 1: us mfm ob detail +14 wk · 13 of 71 slices shown]
[im 3/71]
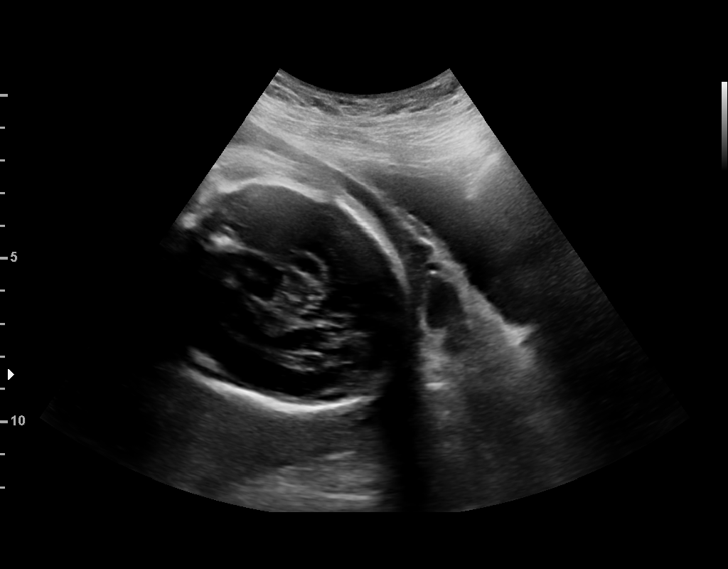
[im 8/71]
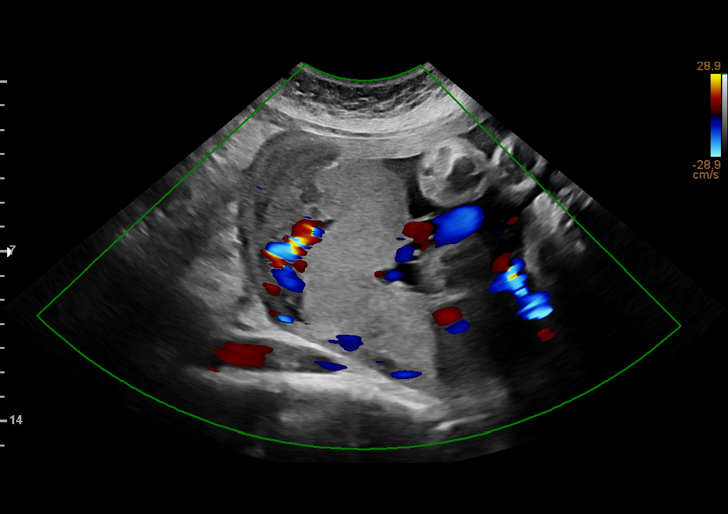
[im 13/71]
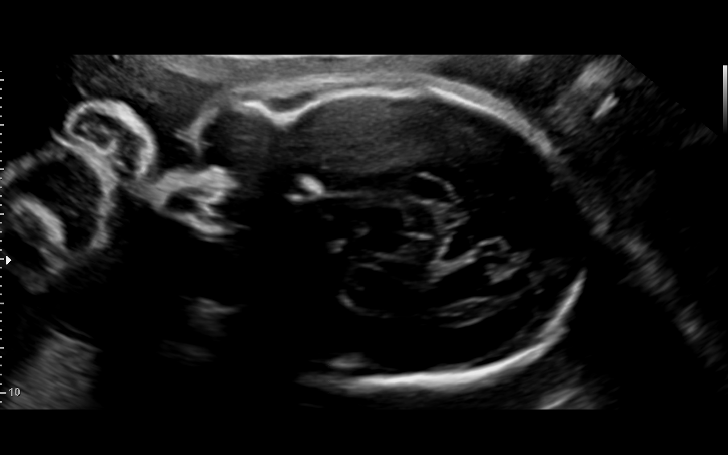
[im 19/71]
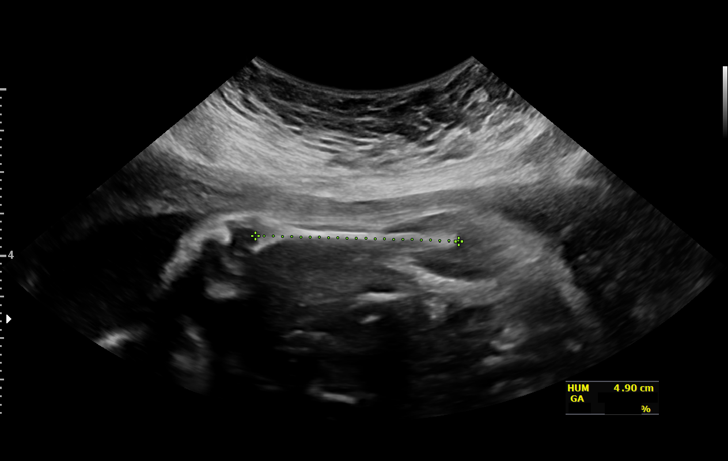
[im 24/71]
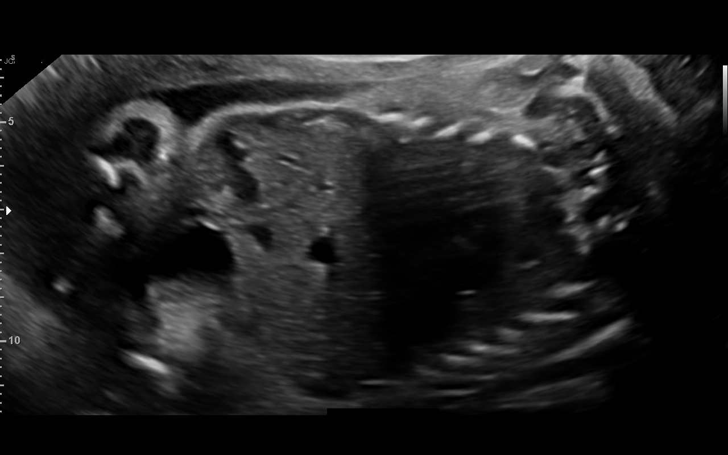
[im 29/71]
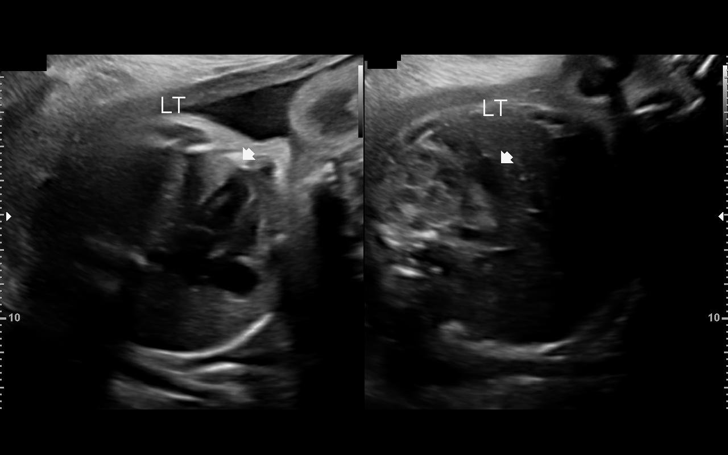
[im 37/71]
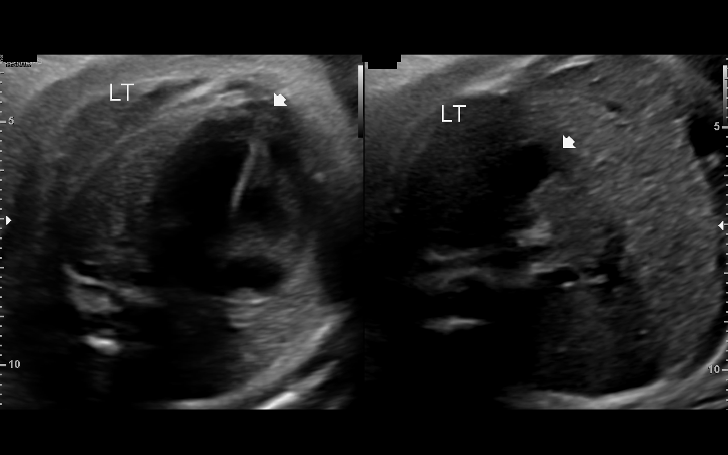
[im 42/71]
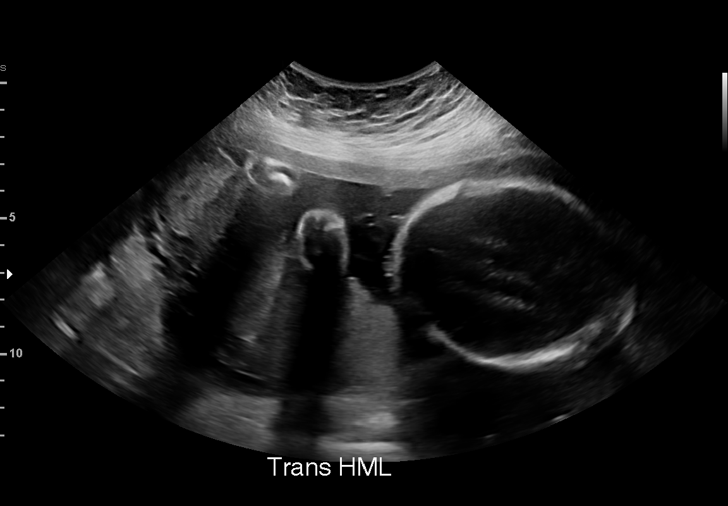
[im 47/71]
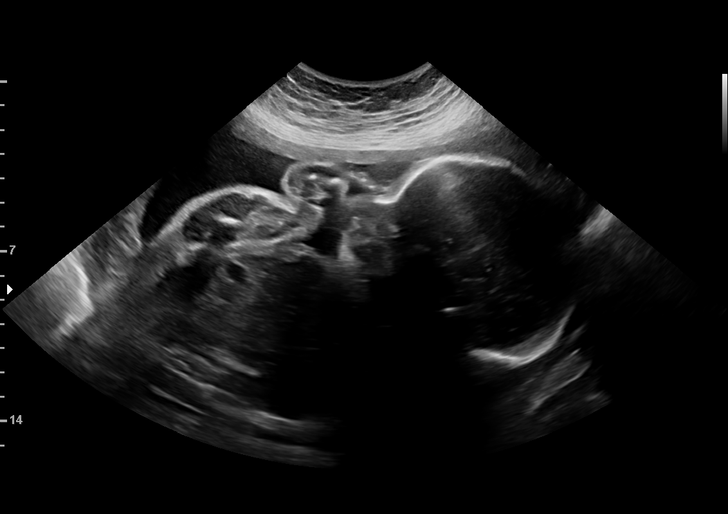
[im 52/71]
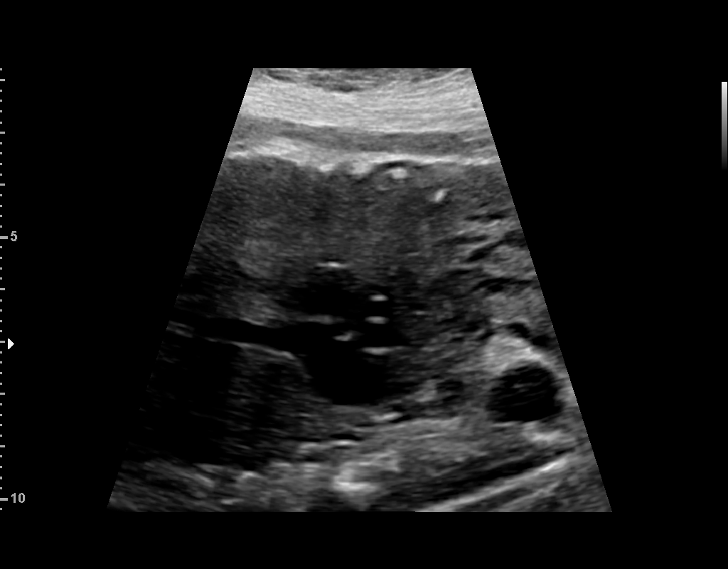
[im 58/71]
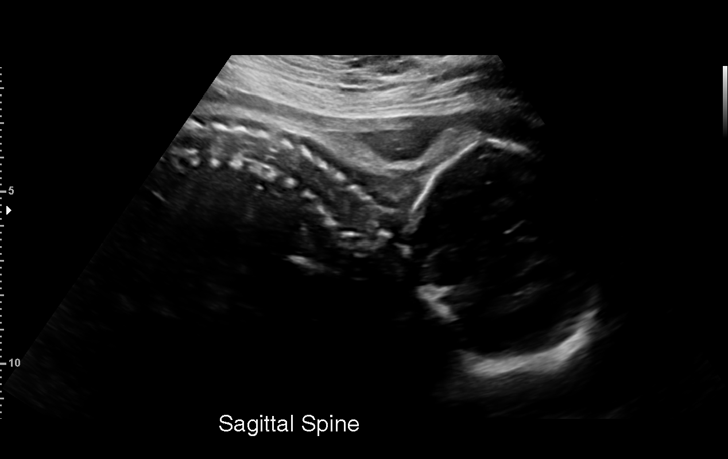
[im 63/71]
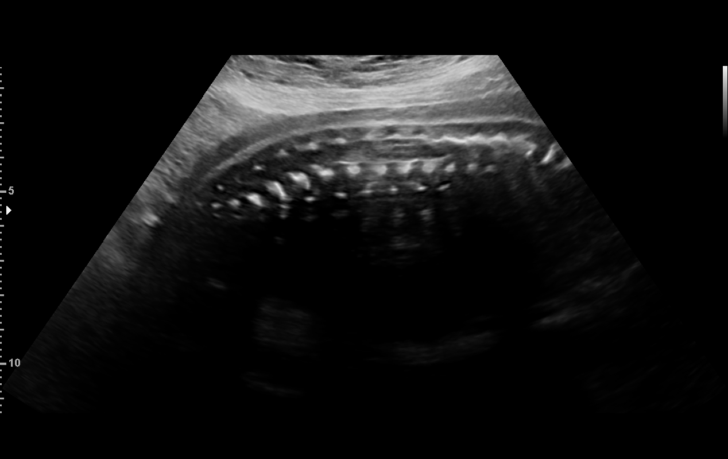
[im 68/71]
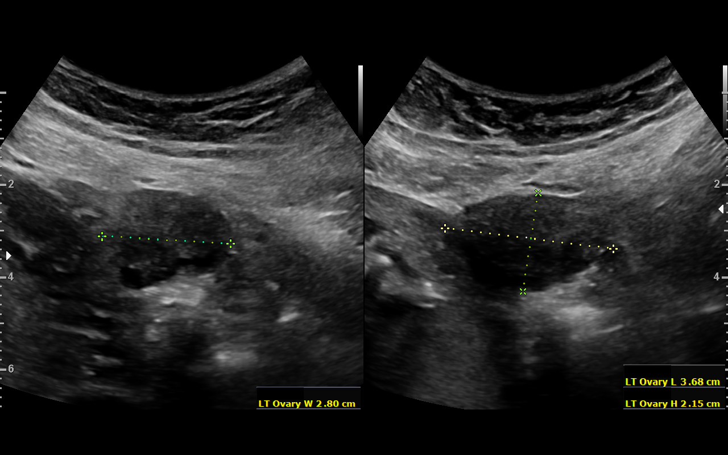

[13 of 28 positions shown; findings below may reference images not displayed]

----------------------------------------------------------------------

 ----------------------------------------------------------------------
Indications

  Encounter for antenatal screening for
  malformations (FANM drawn on 12/03/18)
  Previous cesarean delivery, antepartum
  28 weeks gestation of pregnancy
 ----------------------------------------------------------------------
Vital Signs

 BMI:
Fetal Evaluation

 Num Of Fetuses:         1
 Fetal Heart Rate(bpm):  143
 Cardiac Activity:       Observed
 Presentation:           Transverse, head to maternal left
 Placenta:               Posterior
 P. Cord Insertion:      Visualized

 Amniotic Fluid
 AFI FV:      Within normal limits

                             Largest Pocket(cm)

Biometry

 BPD:      66.5  mm     G. Age:  26w 6d          2  %    CI:        66.63   %    70 - 86
                                                         FL/HC:      21.4   %    19.6 -
 HC:      261.1  mm     G. Age:  28w 3d         13  %    HC/AC:      1.01        0.99 -
 AC:      258.9  mm     G. Age:  30w 1d         80  %    FL/BPD:     83.9   %    71 - 87
 FL:       55.8  mm     G. Age:  29w 3d         54  %    FL/AC:      21.6   %    20 - 24
 HUM:      49.5  mm     G. Age:  29w 1d         51  %
 CER:      32.8  mm     G. Age:  28w 6d         51  %

 LV:        3.3  mm
 CM:        6.6  mm

 Est. FW:    0703  gm      3 lb 1 oz     65  %
OB History

 Gravidity:    3         Term:   2
 Living:       2
Gestational Age

 U/S Today:     28w 5d                                        EDD:   02/24/19
 Best:          28w 5d     Det. By:  U/S (12/07/18)           EDD:   02/24/19
Anatomy

 Cranium:               Appears normal         Aortic Arch:            Not well visualized
 Cavum:                 Appears normal         Ductal Arch:            Not well visualized
 Ventricles:            Appears normal         Diaphragm:              Appears normal
 Choroid Plexus:        Not well visualized    Stomach:                Appears normal, left
                                                                       sided
 Cerebellum:            Appears normal         Abdomen:                Appears normal
 Posterior Fossa:       Appears normal         Abdominal Wall:         Appears nml (cord
                                                                       insert, abd wall)
 Nuchal Fold:           Not applicable (>20    Cord Vessels:           Appears normal (3
                        wks GA)                                        vessel cord)
 Face:                  Orbits appear          Kidneys:                Appear normal
                        normal
 Lips:                  Not well visualized    Bladder:                Appears normal
 Thoracic:              Appears normal         Spine:                  Limited views
                                                                       appear normal
 Heart:                 Appears normal         Upper Extremities:      Visualized
                        (4CH, axis, and
                        situs)
 RVOT:                  Not well visualized    Lower Extremities:      Appears normal
 LVOT:                  Not well visualized

 Other:  Nasal bone visualized. Female gender Technically difficult due to
         maternal habitus and fetal position. LUE visualized
Cervix Uterus Adnexa

 Cervix
 Length:           4.07  cm.
 Normal appearance by transabdominal scan.

 Left Ovary
 Within normal limits.

 Right Ovary
 Within normal limits.

 Cul De Sac
 No free fluid seen.

 Adnexa
 No abnormality visualized. No adnexal mass
 visualized. No free fluid.
Impression

 Ms. Auad, Relindas3 P2, is here for fetal anatomy scan. Gestational
 age was established on the basis of 22-week ultrasound
 performed at [facility]. Only BPD was measured, which
 is not considered enough for dating pregnancy (BPD, HC, AC
 and femur length should be measured after 14 weeks for
 establishing gestational age).

 On today's ultrasound, fetal biometry is consistent with 28w
 5d gestation. No markers of aneuploidies or fetal structural
 defects are seen. Fetal biometry is consistent with her
 previously-established dates. Amniotic fluid is normal and
 good fetal activity is seen.
 Placenta is posterior and there is no evidence of previa or
 accreta.  Patient understands the limitations of ultrasound in
 detecting fetal anomalies.

 We have assigned the EDD at 02/24/2019.

 I explained that dating pregnancy in the third trimester is not
 accurate and has a margin of error up to 2 weeks.
Recommendations

 An appointment was made for her to return in 4 weeks for
 fetal growth assessment and completion of anatomy.
                 Calva, Rifat

## 2019-08-03 IMAGING — US US MFM OB FOLLOW UP
1 series · 14 of 28 positions shown · non-contrast
Comparison: none

[Series 1: us mfm ob follow up · 14 of 102 slices shown]
[im 4/102]
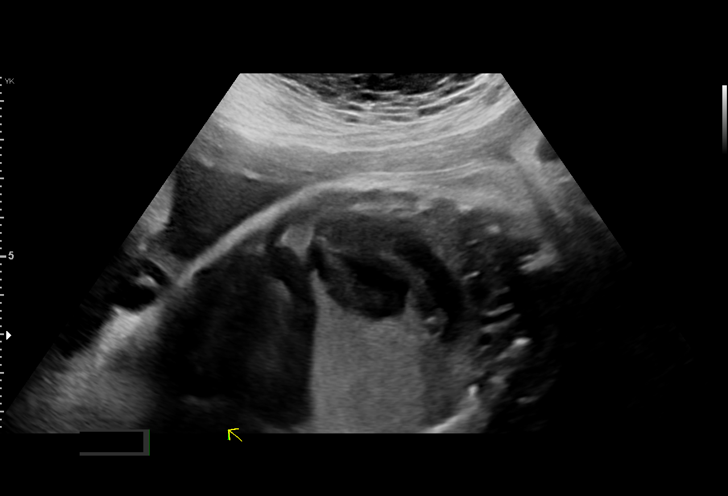
[im 12/102]
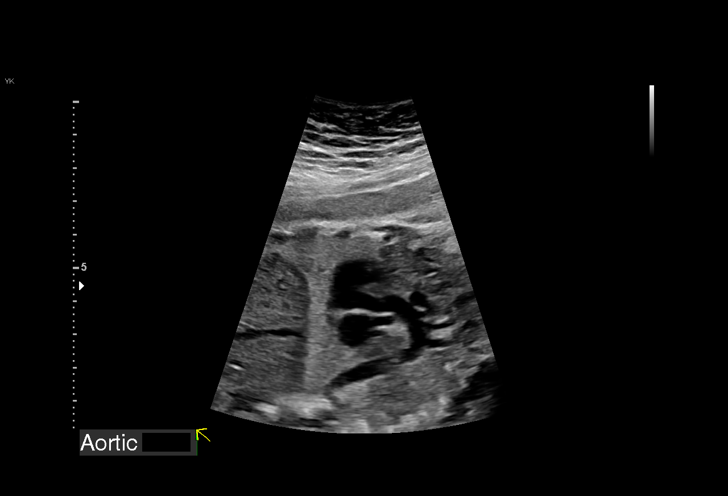
[im 19/102]
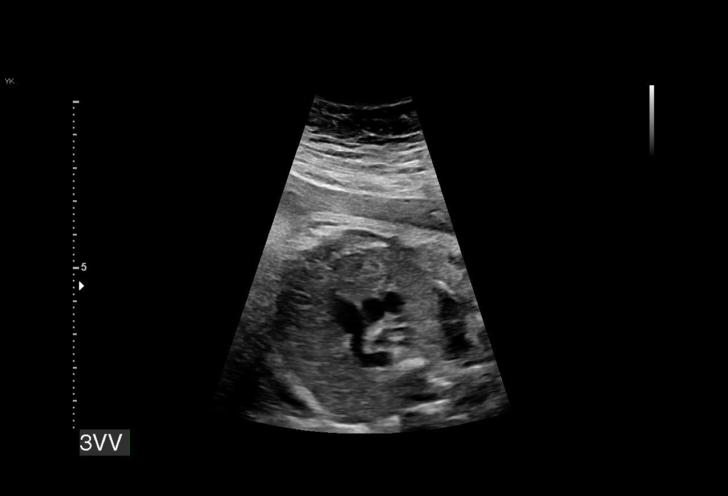
[im 27/102]
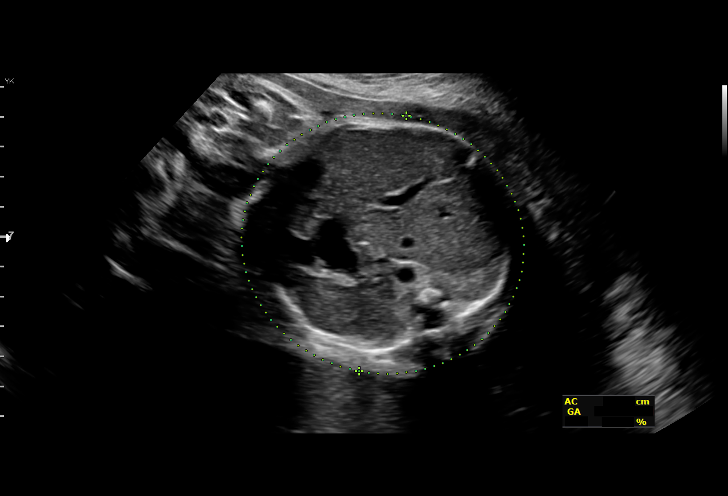
[im 34/102]
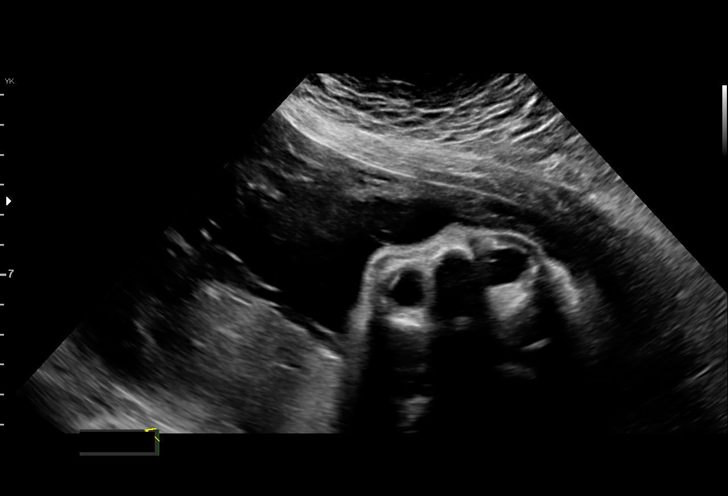
[im 42/102]
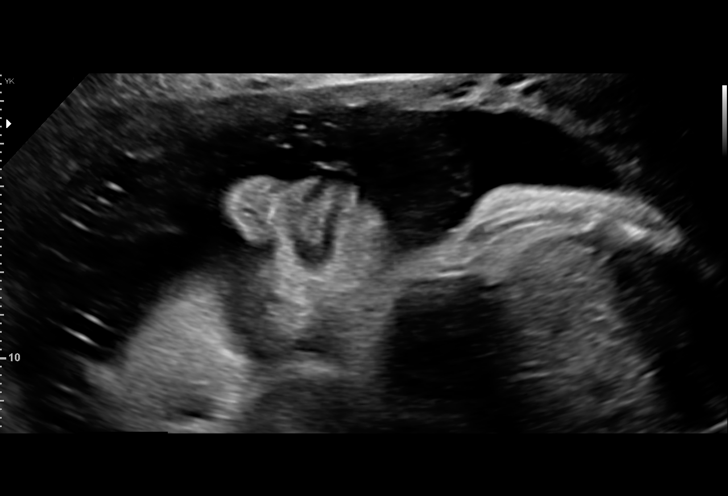
[im 49/102]
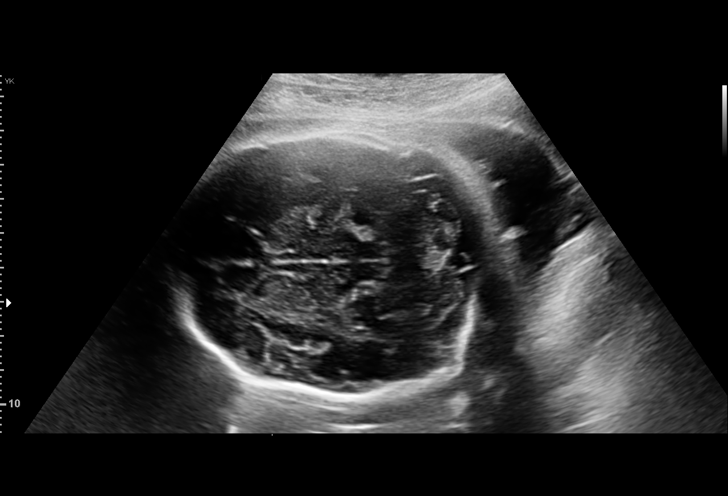
[im 57/102]
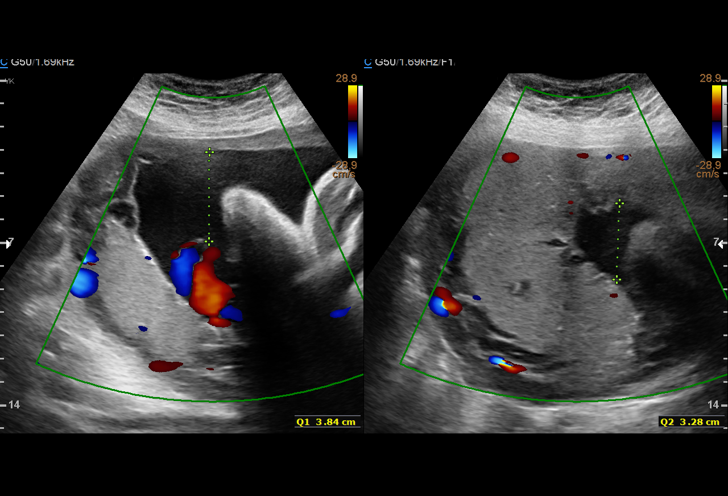
[im 64/102]
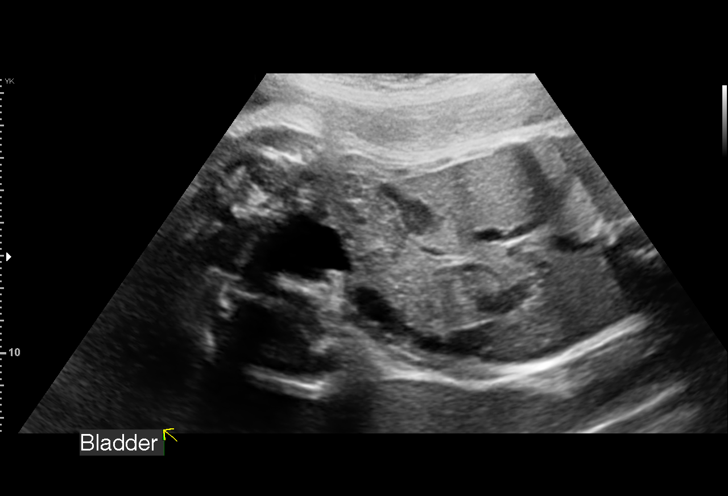
[im 72/102]
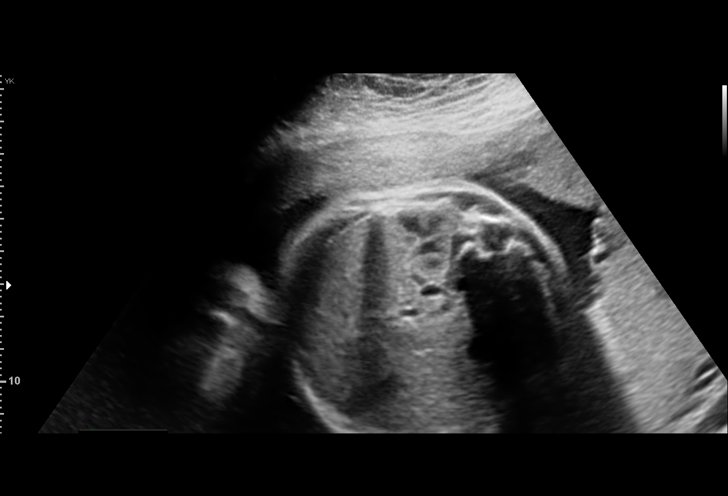
[im 79/102]
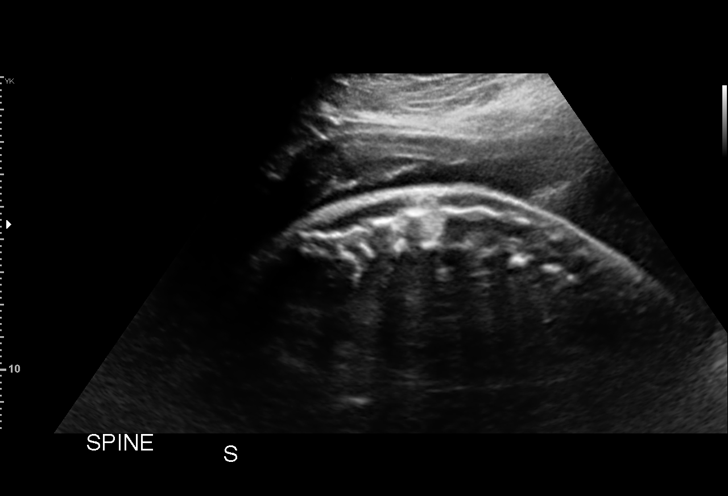
[im 87/102]
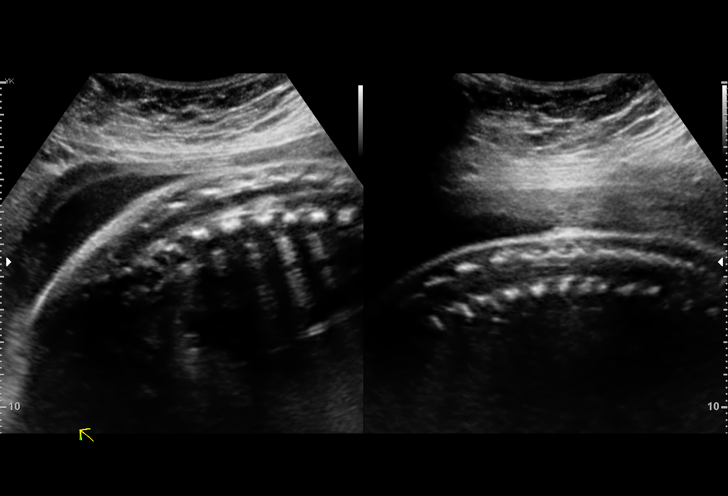
[im 94/102]
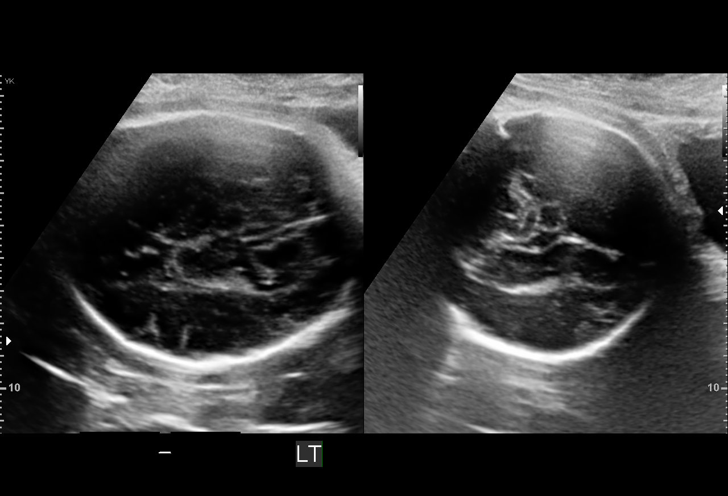
[im 102/102]
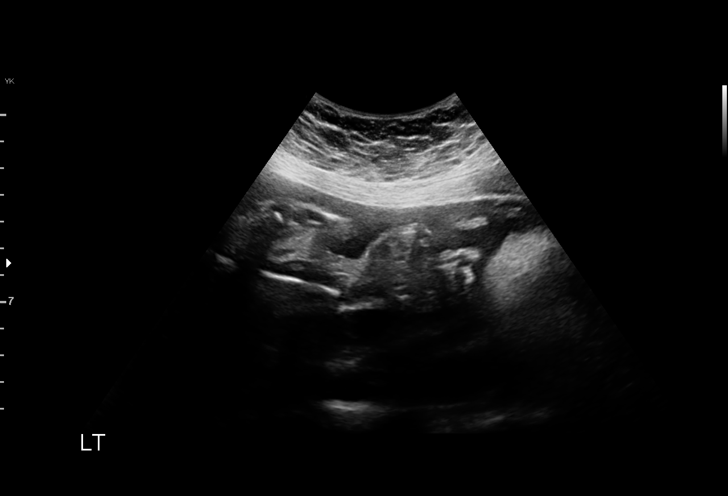

[14 of 28 positions shown; findings below may reference images not displayed]

Name:       RITXI TERAN                   Visit Date: 01/04/2019 [REDACTED]

 ----------------------------------------------------------------------

 ----------------------------------------------------------------------
Indications

  Antenatal follow-up for nonvisualized fetal
  anatomy
  Previous cesarean delivery, antepartum
  32 weeks gestation of pregnancy
 ----------------------------------------------------------------------
Vital Signs

                                                Height:        5'6"
Fetal Evaluation

 Num Of Fetuses:         1
 Fetal Heart Rate(bpm):  116
 Cardiac Activity:       Observed
 Presentation:           Cephalic
 Placenta:               Posterior Fundal
 P. Cord Insertion:      Previously Visualized

 Amniotic Fluid
 AFI FV:      Within normal limits

 AFI Sum(cm)     %Tile       Largest Pocket(cm)
 15.92           57

 RUQ(cm)       RLQ(cm)       LUQ(cm)        LLQ(cm)

Biometry

 BPD:      77.1  mm     G. Age:  31w 0d          5  %    CI:        69.51   %    70 - 86
                                                         FL/HC:      21.4   %    19.9 -
 HC:      295.2  mm     G. Age:  32w 4d         13  %    HC/AC:      1.00        0.96 -
 AC:      293.8  mm     G. Age:  33w 3d         70  %    FL/BPD:     81.8   %    71 - 87
 FL:       63.1  mm     G. Age:  32w 4d         36  %    FL/AC:      21.5   %    20 - 24

 Est. FW:    7918  gm      4 lb 9 oz     44  %
OB History

 Gravidity:    3         Term:   2
 Living:       2
Gestational Age

 U/S Today:     32w 3d                                        EDD:   [DATE]
 Best:          32w 5d     Det. By:  U/S  (02/26/19)          EDD:   12/07/18
Anatomy

 Cranium:               Appears normal         Aortic Arch:            Appears normal
 Cavum:                 Appears normal         Ductal Arch:            Appears normal
 Ventricles:            Appears normal         Diaphragm:              Previously seen
 Choroid Plexus:        Appears normal         Stomach:                Appears normal, left
                                                                       sided
 Cerebellum:            Appears normal         Abdomen:                Appears normal
 Posterior Fossa:       Appears normal         Abdominal Wall:         Previously seen
 Nuchal Fold:           Not applicable (>20    Cord Vessels:           Previously seen
                        wks GA)
 Face:                  Appears normal         Kidneys:                Appear normal
                        (orbits and profile)
 Lips:                  Appears normal         Bladder:                Appears normal
 Thoracic:              Appears normal         Spine:                  Appears normal
 Heart:                 Appears normal         Upper Extremities:      Appears normal
                        (4CH, axis, and
                        situs)
 RVOT:                  Appears normal         Lower Extremities:      Appears normal
 LVOT:                  Appears normal

 Other:  Nasal bone visualized. Female gender Technically difficult due to
         maternal habitus and fetal position. Open hands and heels not
         visualized.
Impression

 Normal interval growth.
Recommendations

 Follow up growth as clinically indicated.

## 2019-08-29 IMAGING — US US MFM OB FOLLOW UP
1 series · 13 of 28 positions shown · non-contrast
Comparison: none

[Series 1: us mfm ob follow up · 28 acquisitions, 13 frames shown]
[im 2/28]
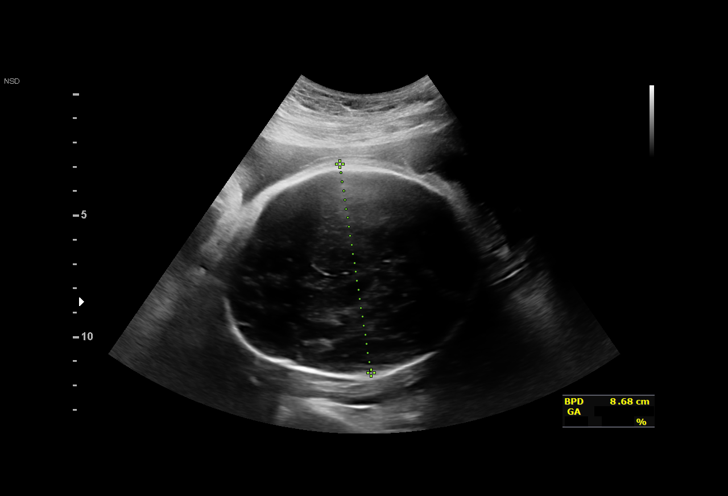
[im 4/28]
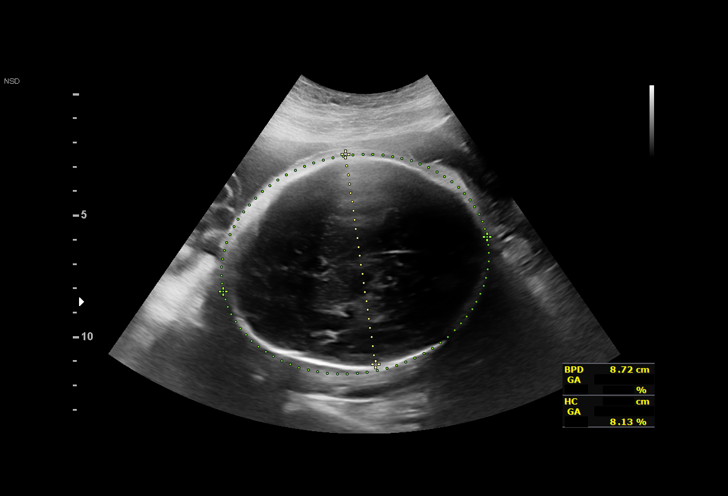
[im 6/28]
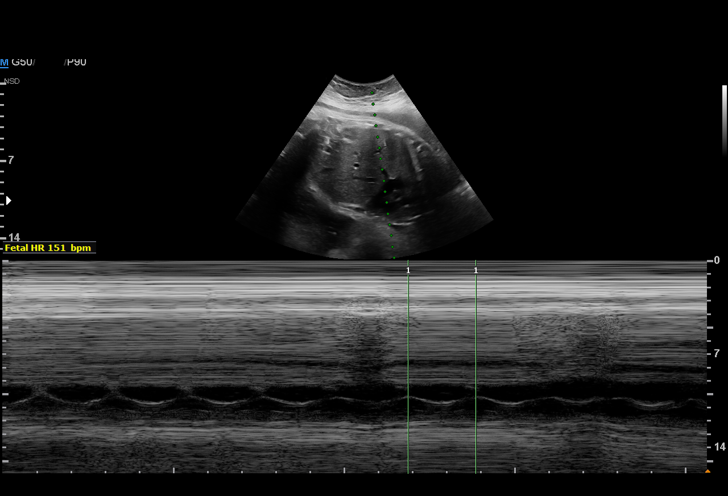
[im 8/28]
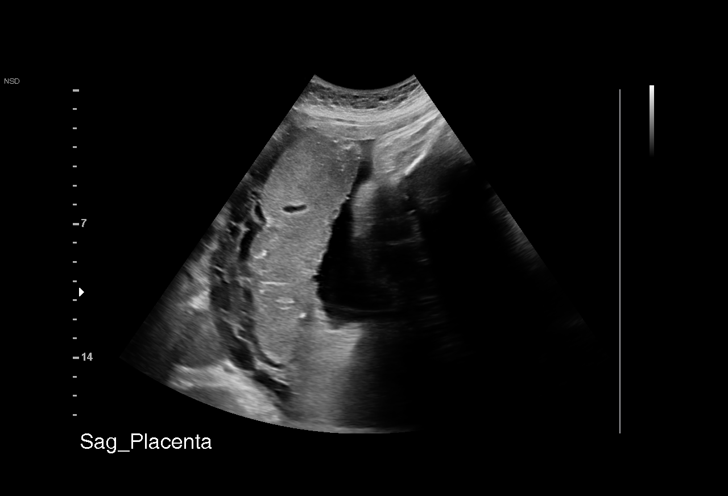
[im 10/28]
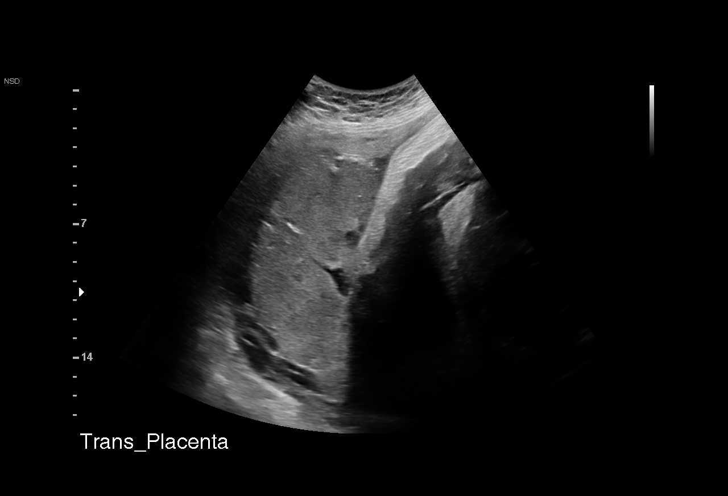
[im 12/28]
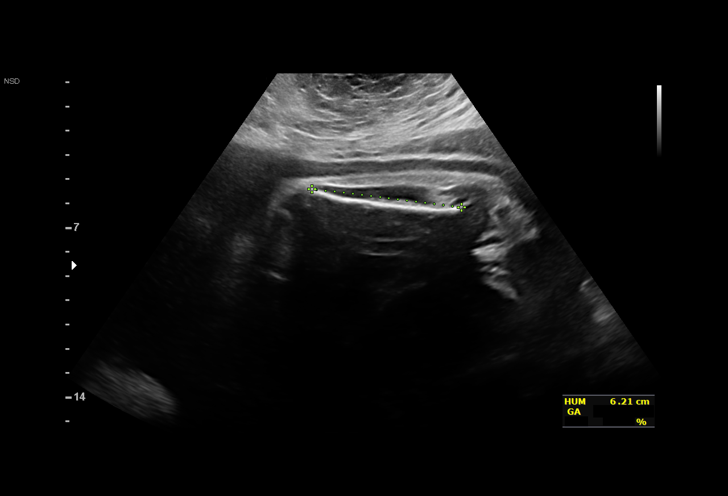
[im 15/28]
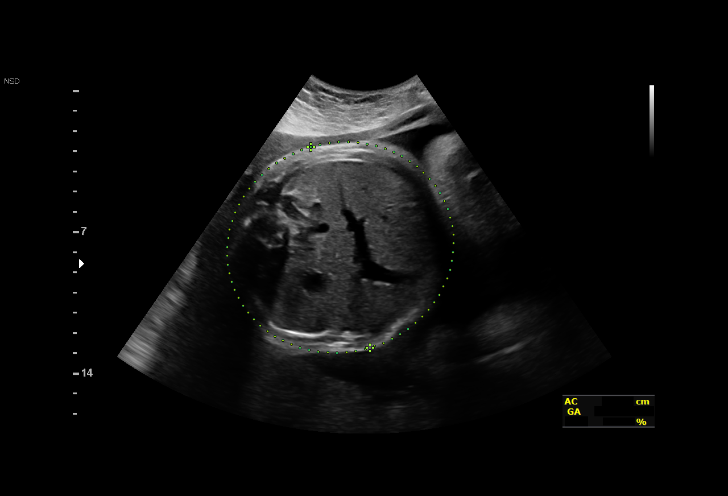
[im 17/28]
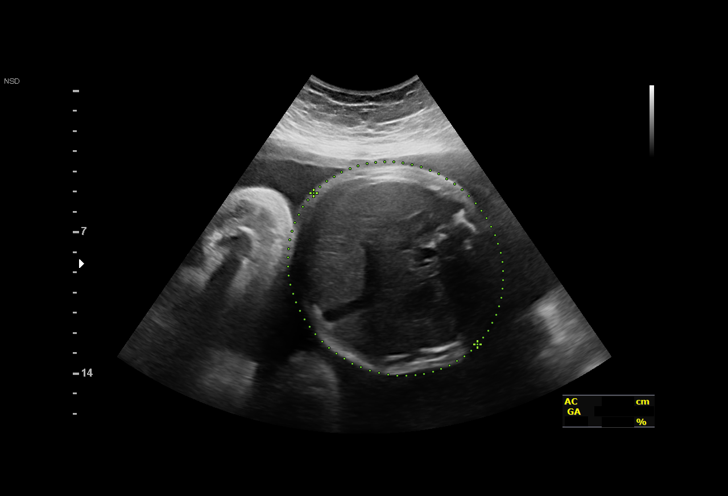
[im 19/28]
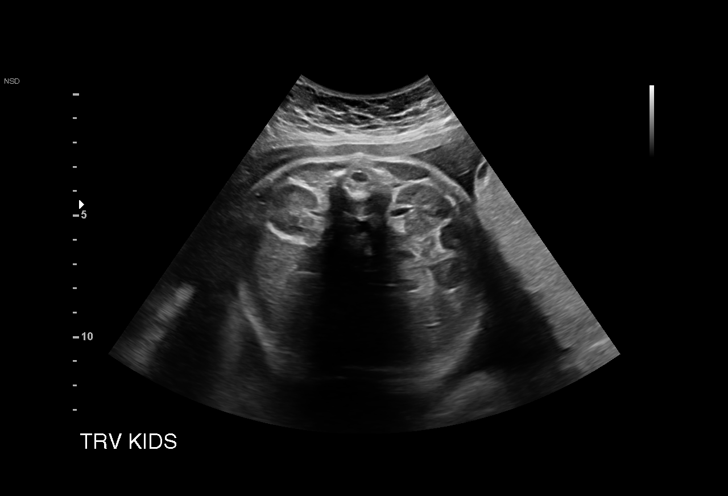
[im 21/28]
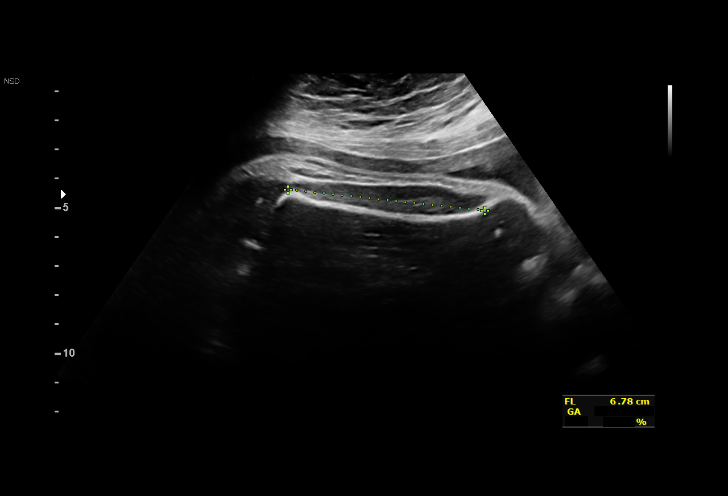
[im 23/28]
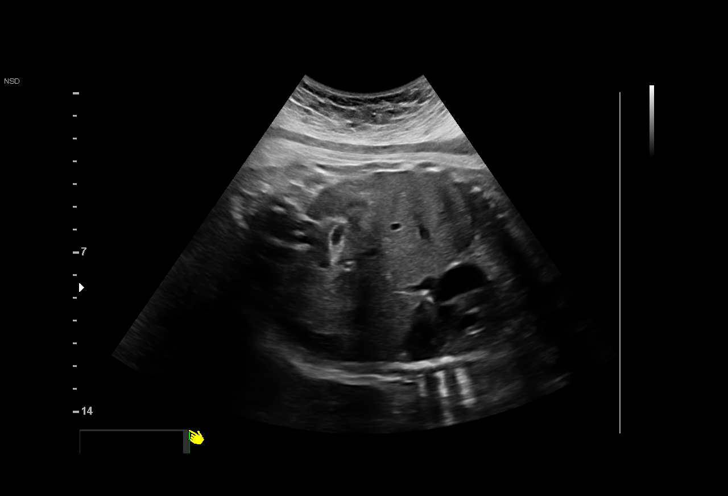
[im 25/28]
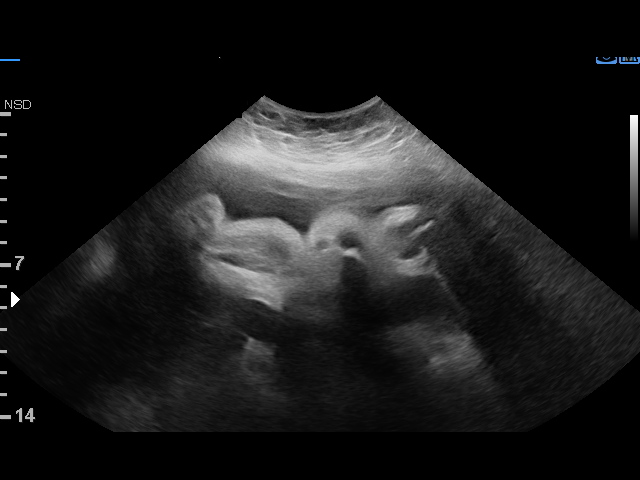
[im 27/28]
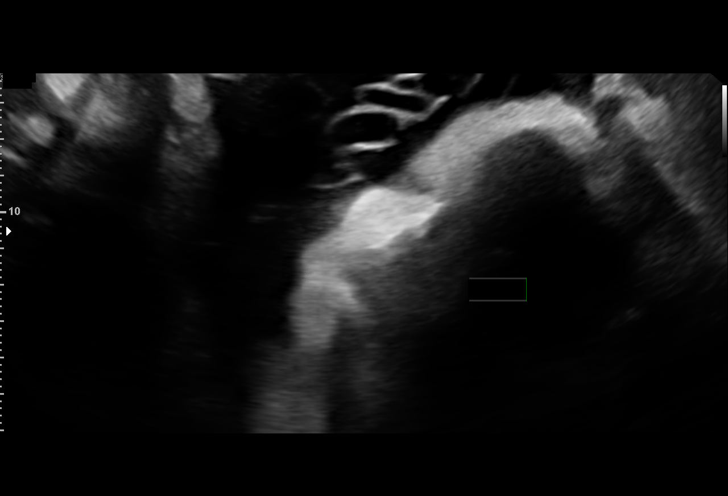

[13 of 28 positions shown; findings below may reference images not displayed]

Name:       YAMI STUCKI                   Visit Date: 01/30/2019 [REDACTED]

 ----------------------------------------------------------------------

 ----------------------------------------------------------------------
Indications

  Previous cesarean delivery, antepartum X 2
  36 weeks gestation of pregnancy
  Tobacco use complicating pregnancy, third
  trimester
  Late to prenatal care, third trimester
  Gestational diabetes in pregnancy, diet
  controlled
 ----------------------------------------------------------------------
Vital Signs

                                                Height:        5'6"
Fetal Evaluation

 Num Of Fetuses:          1
 Fetal Heart Rate(bpm):   151
 Cardiac Activity:        Observed
 Presentation:            Cephalic
 Placenta:                Posterior Fundal
 P. Cord Insertion:       Previously Visualized

 Amniotic Fluid
 AFI FV:      Within normal limits

 AFI Sum(cm)     %Tile       Largest Pocket(cm)
 23.5            90

 RUQ(cm)       RLQ(cm)       LUQ(cm)        LLQ(cm)

Biometry
 BPD:      86.8  mm     G. Age:  35w 0d         22  %    CI:        75.57   %    70 - 86
                                                         FL/HC:       21.4  %    20.1 -
 HC:      316.6  mm     G. Age:  35w 4d          9  %    HC/AC:       0.94       0.93 -
 AC:      336.9  mm     G. Age:  37w 4d         88  %    FL/BPD:      78.0  %    71 - 87
 FL:       67.7  mm     G. Age:  34w 6d         11  %    FL/AC:       20.1  %    20 - 24
 HUM:      61.9  mm     G. Age:  35w 6d         55  %
 LV:        5.5  mm

 Est. FW:    9191   gm     6 lb 7 oz     52  %
OB History

 Gravidity:    3         Term:   2
 Living:       2
Gestational Age

 U/S Today:     35w 5d                                        EDD:   [DATE]
 Best:          36w 3d     Det. By:  U/S  (03/01/19)          EDD:   12/07/18
Anatomy

 Cranium:               Appears normal         Aortic Arch:            Previously seen
 Cavum:                 Previously seen        Ductal Arch:            Previously seen
 Ventricles:            Appears normal         Diaphragm:              Appears normal
 Choroid Plexus:        Previously seen        Stomach:                Appears normal, left
                                                                       sided
 Cerebellum:            Previously seen        Abdomen:                Appears normal
 Posterior Fossa:       Previously seen        Abdominal Wall:         Previously seen
 Nuchal Fold:           Not applicable (>20    Cord Vessels:           Previously seen
                        wks GA)
 Face:                  Orbits and profile     Kidneys:                Appear normal
                        previously seen
 Lips:                  Previously seen        Bladder:                Appears normal
 Thoracic:              Appears normal         Spine:                  Previously seen
 Heart:                 Previously seen        Upper Extremities:      Previously seen
 RVOT:                  Previously seen        Lower Extremities:      Previously seen
 LVOT:                  Previously seen

 Other:  Nasal bone previously visualized. Female gender Technically difficult
         due to maternal habitus and fetal position.
Cervix Uterus Adnexa

 Cervix
 Not visualized (advanced GA >79wks)

 Left Ovary
 Previously seen.

 Right Ovary
 Previously seen

 Adnexa
 No abnormality visualized.
Impression

 Gestational diabetes. Well-controlled on diet.
 Amniotic fluid is normal and good fetal activity is seen. Fetal
 growth is appropriate for gestational age.
Recommendations

 -No follow-up appointments were ma[REDACTED]

## 2022-05-09 ENCOUNTER — Ambulatory Visit (INDEPENDENT_AMBULATORY_CARE_PROVIDER_SITE_OTHER): Payer: Medicaid Other | Admitting: Orthopedic Surgery

## 2022-05-09 ENCOUNTER — Ambulatory Visit (INDEPENDENT_AMBULATORY_CARE_PROVIDER_SITE_OTHER): Payer: Medicaid Other

## 2022-05-09 DIAGNOSIS — M25571 Pain in right ankle and joints of right foot: Secondary | ICD-10-CM

## 2022-05-09 DIAGNOSIS — M12571 Traumatic arthropathy, right ankle and foot: Secondary | ICD-10-CM | POA: Diagnosis not present

## 2022-05-23 ENCOUNTER — Encounter: Payer: Self-pay | Admitting: Orthopedic Surgery

## 2022-05-23 DIAGNOSIS — M12571 Traumatic arthropathy, right ankle and foot: Secondary | ICD-10-CM

## 2022-05-23 MED ORDER — LIDOCAINE HCL 1 % IJ SOLN
2.0000 mL | INTRAMUSCULAR | Status: AC | PRN
  Administered 2022-05-23: 2 mL

## 2022-05-23 MED ORDER — METHYLPREDNISOLONE ACETATE 40 MG/ML IJ SUSP
40.0000 mg | INTRAMUSCULAR | Status: AC | PRN
  Administered 2022-05-23: 40 mg via INTRA_ARTICULAR

## 2022-05-23 NOTE — Progress Notes (Addendum)
Office Visit Note   Patient: Veronica Richardson           Date of Birth: 07/02/1985           MRN: [id number] Visit Date: 05/09/2022              Requested by: No referring provider defined for this encounter. 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  Patient complains of right ankle and foot pain.  Patient states she was told she needed surgery 5 years ago.  Patient is currently smoking.  n who is status post open reduction internal fixation right ankle fracture in 2011 and removal of hardware in 2012  Assessment & Plan: Visit Diagnoses:  1. Pain in right ankle and joints of right foot     Plan: The right ankle was injected we will place her in a short fracture boot reevaluate in 4 weeks.  Discussed the importance of smoking cessation.  Follow-Up Instructions: Return in about 4 weeks (around 06/06/2022).   Ortho Exam  Patient is alert, oriented, no adenopathy, well-dressed, normal affect, normal respiratory effort. Examination patient has a palpable dorsalis pedis pulse she has pain to palpation over the posterior tibial tendon with swelling anteriorly over the ankle.  She has pain to palpation over the talonavicular and tibiotalar joint.  She has varus alignment of the ankle.  Imaging: No results found. No images are attached to the encounter.  Labs: Lab Results  Component Value Date   HGBA1C 5.0 12/03/2018   REPTSTATUS 01/18/2013 FINAL 01/17/2013   CULT NO GROWTH 01/17/2013     No results found for: "ALBUMIN", "PREALBUMIN", "CBC"  No results found for: "MG" No results found for: "VD25OH"  No results found for: "PREALBUMIN"    Latest Ref Rng & Units 02/18/2019    6:48 AM 02/17/2019    3:19 PM 02/15/2019    9:18 AM  CBC EXTENDED  WBC 4.0 - 10.5 K/uL 10.2  15.5  10.5   RBC 3.87 - 5.11 MIL/uL 3.78  4.11  4.60   Hemoglobin 12.0 - 15.0 g/dL 32.9  51.8  84.1    HCT 36.0 - 46.0 % 32.3  34.9  38.5   Platelets 150 - 400 K/uL 125  144  156      There is no height or weight on file to calculate BMI.  Orders:  Orders Placed This Encounter  Procedures   XR Foot Complete Right   XR Ankle Complete Right   No orders of the defined types were placed in this encounter.    Procedures: Medium Joint Inj: R ankle on 05/23/2022 8:01 AM Indications: pain and diagnostic evaluation Details: 22 G 1.5 in needle, anteromedial approach Medications: 2 mL lidocaine 1 %; 40 mg methylPREDNISolone acetate 40 MG/ML Outcome: tolerated well, no immediate complications Procedure, treatment alternatives, risks and benefits explained, specific risks discussed. Consent was given by the patient. Immediately prior to procedure a time out was called to verify the correct patient, procedure, equipment, support staff and site/side marked as required. Patient was prepped and draped in the usual sterile fashion.      Clinical Data: No additional findings.  ROS:  All other systems negative, except as noted in the HPI. Review of Systems  Objective: Vital Signs: There were no vitals taken for this visit.  Specialty Comments:  No specialty comments available.  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 fracture   Depression    H/O varicella    as child    Family History  Problem Relation Age of Onset   Hypertension Father     Hypertension Maternal Aunt    COPD Maternal Aunt    Diabetes Maternal Aunt    Bipolar disorder Maternal Aunt    Hypertension Maternal Uncle    Hypertension Paternal Uncle    Diabetes Paternal Uncle    Heart disease Maternal Grandmother    Heart disease Maternal Grandfather    COPD Mother     Past Surgical History:  Procedure Laterality Date   ANKLE ARTHROSCOPY     Right   ANKLE SURGERY Right    CESAREAN SECTION  04/10/2012   Procedure: CESAREAN SECTION;  Surgeon: Freddrick March. Tenny Craw, MD;  Location: [facility];  Service: Obstetrics;  Laterality: N/A;   CESAREAN SECTION N/A 02/17/2019   Procedure: REPEAT CESAREAN SECTION;  Surgeon: Adam Phenix, MD;  Location: [facility];  Service: Obstetrics;  Laterality: N/A;   Social History   Occupational History   Not on file  Tobacco Use   Smoking status: Every Day    Packs/day: 0.50    Types: Cigarettes   Smokeless tobacco: Never  Vaping Use   Vaping Use: Never used  Substance and Sexual Activity   Alcohol use: No   Drug use: Yes    Types: Marijuana    Comment: at times   Sexual activity: Yes    Birth control/protection: None

## 2022-06-06 ENCOUNTER — Encounter: Payer: Self-pay | Admitting: Orthopedic Surgery

## 2022-06-06 ENCOUNTER — Ambulatory Visit (INDEPENDENT_AMBULATORY_CARE_PROVIDER_SITE_OTHER): Payer: Medicaid Other | Admitting: Orthopedic Surgery

## 2022-06-06 DIAGNOSIS — M12571 Traumatic arthropathy, right ankle and foot: Secondary | ICD-10-CM | POA: Diagnosis not present

## 2022-06-06 NOTE — Progress Notes (Signed)
Office Visit Note   Patient: Veronica Richardson           Date of Birth: 01-Sep-1985           MRN: [id number] Visit Date: 06/06/2022              Requested by: No referring provider defined for this encounter. PCP: Patient, No Pcp Per  Chief Complaint  Patient presents with   Right Ankle - Follow-up      HPI: Patient is a 36 year old woman who presents in follow-up for traumatic arthritis right ankle status post open reduction internal fixation and removal of the hardware.  Patient states that she had 2 weeks of relief with the intra-articular steroid injection of the right ankle.  Assessment & Plan: Visit Diagnoses:  1. Traumatic arthritis of right ankle     Plan: Discussed with the patient with the bone-on-bone contact that arthroscopic debridement probably would not provide much relief.  Discussed treatment options include total ankle arthroplasty versus fusion.  I feel with patient's young age a fusion would be more durable.  Patient states she would like to proceed with surgery she will call to set this up.  Discussed the importance of smoking cessation to minimize risks including infection and nonhealing of the fusion.  Discussed that we would need to get her hindfoot out of varus.  She will call when she is ready to schedule surgery.  Follow-Up Instructions: Return in about 2 weeks (around 06/20/2022).   Ortho Exam  Patient is alert, oriented, no adenopathy, well-dressed, normal affect, normal respiratory effort. Examination patient has a palpable dorsalis pedis pulse she has dorsiflexion to neutral the calcaneus is in varus.  She has pain primarily to palpation over the anterior joint line.  The peroneal and posterior tibial tendons are nontender to palpation.  Patient states she is having burning pain now over the anterior lateral joint line.  Radiograph shows bone-on-bone contact medial joint line with varus alignment of the talus.  There are bony spurs of the medial malleolus and  medial talus.  She has some mild spurring through the midfoot.  Imaging: No results found. No images are attached to the encounter.  Labs: Lab Results  Component Value Date   HGBA1C 5.0 12/03/2018   REPTSTATUS 01/18/2013 FINAL 01/17/2013   CULT NO GROWTH 01/17/2013     No results found for: "ALBUMIN", "PREALBUMIN", "CBC"  No results found for: "MG" No results found for: "VD25OH"  No results found for: "PREALBUMIN"    Latest Ref Rng & Units 02/18/2019    6:48 AM 02/17/2019    3:19 PM 02/15/2019    9:18 AM  CBC EXTENDED  WBC 4.0 - 10.5 K/uL 10.2  15.5  10.5   RBC 3.87 - 5.11 MIL/uL 3.78  4.11  4.60   Hemoglobin 12.0 - 15.0 g/dL 32.9  92.4  26.8   HCT 36.0 - 46.0 % 32.3  34.9  38.5   Platelets 150 - 400 K/uL 125  144  156      There is no height or weight on file to calculate BMI.  Orders:  No orders of the defined types were placed in this encounter.  No orders of the defined types were placed in this encounter.    Procedures: No procedures performed  Clinical Data: No additional findings.  ROS:  All other systems negative, except as noted in the HPI. Review of Systems  Objective: Vital Signs: There were no vitals taken for this visit.  Specialty Comments:  No specialty comments available.  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 fracture   Depression    H/O varicella    as child    Family History  Problem  Relation Age of Onset   Hypertension Father    Hypertension Maternal Aunt    COPD Maternal Aunt    Diabetes Maternal Aunt    Bipolar disorder Maternal Aunt    Hypertension Maternal Uncle    Hypertension Paternal Uncle    Diabetes Paternal Uncle    Heart disease Maternal Grandmother    Heart disease Maternal Grandfather    COPD Mother     Past Surgical History:  Procedure Laterality Date   ANKLE ARTHROSCOPY     Right   ANKLE SURGERY Right    CESAREAN SECTION  04/10/2012   Procedure: CESAREAN SECTION;  Surgeon: Freddrick March. Tenny Craw, MD;  Location: [facility];  Service: Obstetrics;  Laterality: N/A;   CESAREAN SECTION N/A 02/17/2019   Procedure: REPEAT CESAREAN SECTION;  Surgeon: Adam Phenix, MD;  Location: [facility];  Service: Obstetrics;  Laterality: N/A;   Social History   Occupational History   Not on file  Tobacco Use   Smoking status: Every Day    Packs/day: 0.50    Types: Cigarettes   Smokeless tobacco: Never  Vaping Use   Vaping Use: Never used  Substance and Sexual Activity   Alcohol use: No   Drug use: Yes    Types: Marijuana    Comment: at times   Sexual activity: Yes    Birth control/protection: None

## 2022-07-22 ENCOUNTER — Ambulatory Visit: Admit: 2022-07-22 | Payer: Medicaid Other | Admitting: Orthopedic Surgery

## 2022-07-22 SURGERY — ANKLE FUSION
Anesthesia: Choice | Site: Ankle | Laterality: Right

## 2024-01-02 ENCOUNTER — Ambulatory Visit
Admission: EM | Admit: 2024-01-02 | Discharge: 2024-01-02 | Disposition: A | Discharge status: Home / Self Care | Attending: Family Medicine | Admitting: Family Medicine

## 2024-01-02 DIAGNOSIS — L539 Erythematous condition, unspecified: Secondary | ICD-10-CM

## 2024-01-02 DIAGNOSIS — T2122XA Burn of second degree of abdominal wall, initial encounter: Secondary | ICD-10-CM

## 2024-01-02 MED ORDER — SILVER SULFADIAZINE 1 % EX CREA: 1.0000 | TOPICAL_CREAM | Freq: Every day | CUTANEOUS | 0 refills | Status: AC

## 2024-01-02 MED ORDER — TETANUS-DIPHTH-ACELL PERTUSSIS 5-2.5-18.5 LF-MCG/0.5 IM SUSY
0.5000 mL | PREFILLED_SYRINGE | Freq: Once | INTRAMUSCULAR | Status: AC
  Administered 2024-01-02: 0.5 mL via INTRAMUSCULAR

## 2024-01-02 MED ORDER — IBUPROFEN 600 MG PO TABS: 600.0000 mg | ORAL_TABLET | Freq: Three times a day (TID) | ORAL | 0 refills | Status: AC | PRN
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
## 2024-01-02 NOTE — Discharge Instructions (Addendum)
 You have been given a Tdap vaccination to boost your tetanus immunity  Take ibuprofen  600 mg--1 tab every 8 hours as needed for pain.  Wash the wound on your abdomen with soapy water  1-2 times daily.  Let it air dry and then put new Silvadene  cream on and a new clean dressing.  If it has increasing pain or redness or swelling in the area or drainage, please return to be seen for possible wound infection.
